# Patient Record
Sex: Female | Born: 1985 | Race: Black or African American | Hispanic: No | Marital: Married | Smoking: Never smoker
Health system: Southern US, Community
[De-identification: ages and names within clinical notes are randomized; demographics above are authoritative.]

## PROBLEM LIST (undated history)

## (undated) DIAGNOSIS — J45990 Exercise induced bronchospasm: Secondary | ICD-10-CM

## (undated) DIAGNOSIS — Z8759 Personal history of other complications of pregnancy, childbirth and the puerperium: Secondary | ICD-10-CM

## (undated) DIAGNOSIS — K219 Gastro-esophageal reflux disease without esophagitis: Secondary | ICD-10-CM

## (undated) DIAGNOSIS — Z22330 Carrier of Group B streptococcus: Secondary | ICD-10-CM

## (undated) DIAGNOSIS — E669 Obesity, unspecified: Secondary | ICD-10-CM

## (undated) DIAGNOSIS — Z8709 Personal history of other diseases of the respiratory system: Secondary | ICD-10-CM

## (undated) DIAGNOSIS — O24419 Gestational diabetes mellitus in pregnancy, unspecified control: Secondary | ICD-10-CM

## (undated) DIAGNOSIS — F329 Major depressive disorder, single episode, unspecified: Secondary | ICD-10-CM

## (undated) DIAGNOSIS — O09299 Supervision of pregnancy with other poor reproductive or obstetric history, unspecified trimester: Secondary | ICD-10-CM

## (undated) DIAGNOSIS — R002 Palpitations: Secondary | ICD-10-CM

## (undated) DIAGNOSIS — F32A Depression, unspecified: Secondary | ICD-10-CM

## (undated) HISTORY — DX: Personal history of other diseases of the respiratory system: Z87.09

## (undated) HISTORY — DX: Carrier of group B Streptococcus: Z22.330

## (undated) HISTORY — DX: Major depressive disorder, single episode, unspecified: F32.9

## (undated) HISTORY — DX: Gastro-esophageal reflux disease without esophagitis: K21.9

## (undated) HISTORY — DX: Depression, unspecified: F32.A

## (undated) HISTORY — DX: Supervision of pregnancy with other poor reproductive or obstetric history, unspecified trimester: O09.299

## (undated) HISTORY — DX: Gestational diabetes mellitus in pregnancy, unspecified control: O24.419

## (undated) HISTORY — DX: Personal history of other complications of pregnancy, childbirth and the puerperium: Z87.59

## (undated) HISTORY — DX: Palpitations: R00.2

## (undated) HISTORY — DX: Exercise induced bronchospasm: J45.990

## (undated) HISTORY — PX: BREAST LUMPECTOMY: SHX2

---

## 2008-01-30 HISTORY — PX: WISDOM TOOTH EXTRACTION: SHX21

## 2014-02-15 LAB — OB RESULTS CONSOLE GC/CHLAMYDIA
CHLAMYDIA, DNA PROBE: NEGATIVE
GC PROBE AMP, GENITAL: NEGATIVE

## 2014-02-15 LAB — OB RESULTS CONSOLE ABO/RH: RH TYPE: POSITIVE

## 2014-02-15 LAB — OB RESULTS CONSOLE ANTIBODY SCREEN: Antibody Screen: NEGATIVE

## 2014-02-15 LAB — OB RESULTS CONSOLE RPR: RPR: NONREACTIVE

## 2014-02-15 LAB — OB RESULTS CONSOLE HIV ANTIBODY (ROUTINE TESTING): HIV: NONREACTIVE

## 2014-02-15 LAB — OB RESULTS CONSOLE PLATELET COUNT: PLATELETS: 299 10*3/uL

## 2014-02-15 LAB — OB RESULTS CONSOLE HEPATITIS B SURFACE ANTIGEN: Hepatitis B Surface Ag: NEGATIVE

## 2014-02-15 LAB — OB RESULTS CONSOLE HGB/HCT, BLOOD: HEMOGLOBIN: 12.5 g/dL

## 2014-09-03 LAB — OB RESULTS CONSOLE GBS: STREP GROUP B AG: NEGATIVE

## 2014-10-04 ENCOUNTER — Encounter (HOSPITAL_COMMUNITY): Payer: Self-pay | Admitting: *Deleted

## 2014-10-04 ENCOUNTER — Inpatient Hospital Stay (HOSPITAL_COMMUNITY)
Admission: AD | Admit: 2014-10-04 | Discharge: 2014-10-08 | DRG: 765 | Disposition: A | Discharge status: Home / Self Care | Payer: BC Managed Care – PPO | Source: Ambulatory Visit | Attending: Obstetrics and Gynecology | Admitting: Obstetrics and Gynecology

## 2014-10-04 ENCOUNTER — Encounter (HOSPITAL_COMMUNITY)
Admission: AD | Disposition: A | Discharge status: Home / Self Care | Payer: Self-pay | Source: Ambulatory Visit | Attending: Obstetrics and Gynecology

## 2014-10-04 ENCOUNTER — Inpatient Hospital Stay (HOSPITAL_COMMUNITY): Payer: BC Managed Care – PPO | Admitting: Anesthesiology

## 2014-10-04 DIAGNOSIS — O480 Post-term pregnancy: Principal | ICD-10-CM | POA: Diagnosis present

## 2014-10-04 DIAGNOSIS — O133 Gestational [pregnancy-induced] hypertension without significant proteinuria, third trimester: Secondary | ICD-10-CM | POA: Diagnosis present

## 2014-10-04 DIAGNOSIS — Z6837 Body mass index (BMI) 37.0-37.9, adult: Secondary | ICD-10-CM | POA: Diagnosis not present

## 2014-10-04 DIAGNOSIS — O1493 Unspecified pre-eclampsia, third trimester: Secondary | ICD-10-CM | POA: Diagnosis present

## 2014-10-04 DIAGNOSIS — O99214 Obesity complicating childbirth: Secondary | ICD-10-CM | POA: Diagnosis present

## 2014-10-04 DIAGNOSIS — Z3A41 41 weeks gestation of pregnancy: Secondary | ICD-10-CM | POA: Diagnosis present

## 2014-10-04 DIAGNOSIS — Z98891 History of uterine scar from previous surgery: Secondary | ICD-10-CM

## 2014-10-04 DIAGNOSIS — O139 Gestational [pregnancy-induced] hypertension without significant proteinuria, unspecified trimester: Secondary | ICD-10-CM | POA: Diagnosis present

## 2014-10-04 DIAGNOSIS — J45990 Exercise induced bronchospasm: Secondary | ICD-10-CM | POA: Diagnosis not present

## 2014-10-04 HISTORY — DX: Obesity, unspecified: E66.9

## 2014-10-04 LAB — COMPREHENSIVE METABOLIC PANEL
ALT: 12 U/L — ABNORMAL LOW (ref 14–54)
ANION GAP: 7 (ref 5–15)
AST: 24 U/L (ref 15–41)
Albumin: 3 g/dL — ABNORMAL LOW (ref 3.5–5.0)
Alkaline Phosphatase: 147 U/L — ABNORMAL HIGH (ref 38–126)
BILIRUBIN TOTAL: 0.4 mg/dL (ref 0.3–1.2)
BUN: 9 mg/dL (ref 6–20)
CO2: 22 mmol/L (ref 22–32)
Calcium: 9.2 mg/dL (ref 8.9–10.3)
Chloride: 105 mmol/L (ref 101–111)
Creatinine, Ser: 0.65 mg/dL (ref 0.44–1.00)
GFR calc Af Amer: >60 mL/min (ref >60)
Glucose, Bld: 88 mg/dL (ref 65–99)
POTASSIUM: 4.1 mmol/L (ref 3.5–5.1)
Sodium: 134 mmol/L — ABNORMAL LOW (ref 135–145)
TOTAL PROTEIN: 6.4 g/dL — AB (ref 6.5–8.1)

## 2014-10-04 LAB — RPR: RPR Ser Ql: NONREACTIVE

## 2014-10-04 LAB — CBC
HCT: 34.3 % — ABNORMAL LOW (ref 36.0–46.0)
Hemoglobin: 11.6 g/dL — ABNORMAL LOW (ref 12.0–15.0)
MCH: 28.9 pg (ref 26.0–34.0)
MCHC: 33.8 g/dL (ref 30.0–36.0)
MCV: 85.3 fL (ref 78.0–100.0)
PLATELETS: 161 10*3/uL (ref 150–400)
RBC: 4.02 MIL/uL (ref 3.87–5.11)
RDW: 14.6 % (ref 11.5–15.5)
WBC: 9.5 10*3/uL (ref 4.0–10.5)

## 2014-10-04 LAB — ABO/RH: ABO/RH(D): A POS

## 2014-10-04 LAB — URIC ACID: Uric Acid, Serum: 5 mg/dL (ref 2.3–6.6)

## 2014-10-04 LAB — LACTATE DEHYDROGENASE: LDH: 183 U/L (ref 98–192)

## 2014-10-04 LAB — PROTEIN / CREATININE RATIO, URINE
Creatinine, Urine: 108 mg/dL
Protein Creatinine Ratio: 0.53 mg/mg{Cre} — ABNORMAL HIGH (ref 0.00–0.15)
Total Protein, Urine: 57 mg/dL

## 2014-10-04 LAB — TYPE AND SCREEN
ABO/RH(D): A POS
ANTIBODY SCREEN: NEGATIVE

## 2014-10-04 LAB — HIV ANTIBODY (ROUTINE TESTING W REFLEX): HIV SCREEN 4TH GENERATION: NONREACTIVE

## 2014-10-04 LAB — OB RESULTS CONSOLE RUBELLA ANTIBODY, IGM: RUBELLA: IMMUNE

## 2014-10-04 SURGERY — Surgical Case
Anesthesia: Epidural | Site: Abdomen

## 2014-10-04 MED ORDER — EPHEDRINE 5 MG/ML INJ: 10.0000 mg | INTRAVENOUS | Status: DC | PRN

## 2014-10-04 MED ORDER — CEFAZOLIN SODIUM-DEXTROSE 2-3 GM-% IV SOLR
2.0000 g | Freq: Once | INTRAVENOUS | Status: DC
  Filled 2014-10-04: qty 50

## 2014-10-04 MED ORDER — LACTATED RINGERS IV SOLN
INTRAVENOUS | Status: DC | PRN
  Administered 2014-10-04 (×2): via INTRAVENOUS

## 2014-10-04 MED ORDER — ACETAMINOPHEN 325 MG PO TABS: 650.0000 mg | ORAL_TABLET | ORAL | Status: DC | PRN

## 2014-10-04 MED ORDER — DIPHENHYDRAMINE HCL 50 MG/ML IJ SOLN: 12.5000 mg | INTRAMUSCULAR | Status: DC | PRN

## 2014-10-04 MED ORDER — TERBUTALINE SULFATE 1 MG/ML IJ SOLN: 0.2500 mg | Freq: Once | INTRAMUSCULAR | Status: DC | PRN

## 2014-10-04 MED ORDER — LACTATED RINGERS IV SOLN
INTRAVENOUS | Status: DC
  Administered 2014-10-04: 16:00:00 via INTRAUTERINE

## 2014-10-04 MED ORDER — ONDANSETRON HCL 4 MG/2ML IJ SOLN
INTRAMUSCULAR | Status: DC | PRN
  Administered 2014-10-04: 4 mg via INTRAVENOUS

## 2014-10-04 MED ORDER — KETOROLAC TROMETHAMINE 30 MG/ML IJ SOLN
30.0000 mg | Freq: Four times a day (QID) | INTRAMUSCULAR | Status: DC | PRN
  Filled 2014-10-04: qty 1

## 2014-10-04 MED ORDER — HYDRALAZINE HCL 20 MG/ML IJ SOLN: 10.0000 mg | Freq: Once | INTRAMUSCULAR | Status: DC | PRN

## 2014-10-04 MED ORDER — OXYCODONE-ACETAMINOPHEN 5-325 MG PO TABS: 2.0000 | ORAL_TABLET | ORAL | Status: DC | PRN

## 2014-10-04 MED ORDER — LIDOCAINE HCL 2 % IJ SOLN
INTRAMUSCULAR | Status: DC | PRN
  Administered 2014-10-04: 8 mL

## 2014-10-04 MED ORDER — LIDOCAINE HCL (PF) 1 % IJ SOLN: 30.0000 mL | INTRAMUSCULAR | Status: DC | PRN

## 2014-10-04 MED ORDER — CITRIC ACID-SODIUM CITRATE 334-500 MG/5ML PO SOLN
30.0000 mL | ORAL | Status: DC | PRN
  Administered 2014-10-04: 30 mL via ORAL
  Filled 2014-10-04: qty 15

## 2014-10-04 MED ORDER — PHENYLEPHRINE 40 MCG/ML (10ML) SYRINGE FOR IV PUSH (FOR BLOOD PRESSURE SUPPORT)
80.0000 ug | PREFILLED_SYRINGE | INTRAVENOUS | Status: DC | PRN
  Filled 2014-10-04: qty 20

## 2014-10-04 MED ORDER — LIDOCAINE HCL (PF) 1 % IJ SOLN
INTRAMUSCULAR | Status: DC | PRN
  Administered 2014-10-04 (×2): 9 mL via EPIDURAL

## 2014-10-04 MED ORDER — FENTANYL 2.5 MCG/ML BUPIVACAINE 1/10 % EPIDURAL INFUSION (WH - ANES)
14.0000 mL/h | INTRAMUSCULAR | Status: DC | PRN
  Administered 2014-10-04 (×3): 14 mL/h via EPIDURAL
  Filled 2014-10-04 (×2): qty 125

## 2014-10-04 MED ORDER — OXYTOCIN 40 UNITS IN LACTATED RINGERS INFUSION - SIMPLE MED: 62.5000 mL/h | INTRAVENOUS | Status: DC

## 2014-10-04 MED ORDER — ZOLPIDEM TARTRATE 5 MG PO TABS: 5.0000 mg | ORAL_TABLET | Freq: Every evening | ORAL | Status: DC | PRN

## 2014-10-04 MED ORDER — OXYTOCIN BOLUS FROM INFUSION: 500.0000 mL | INTRAVENOUS | Status: DC

## 2014-10-04 MED ORDER — MORPHINE SULFATE 0.5 MG/ML IJ SOLN
INTRAMUSCULAR | Status: AC
Start: 2014-10-04 — End: 2014-10-04
  Filled 2014-10-04: qty 100

## 2014-10-04 MED ORDER — LIDOCAINE HCL 2 % IJ SOLN
INTRAMUSCULAR | Status: AC
  Filled 2014-10-04: qty 20

## 2014-10-04 MED ORDER — STERILE WATER FOR IRRIGATION IR SOLN
Status: DC | PRN
  Administered 2014-10-04: 1000 mL via INTRAVESICAL

## 2014-10-04 MED ORDER — ONDANSETRON HCL 4 MG/2ML IJ SOLN
INTRAMUSCULAR | Status: AC
  Filled 2014-10-04: qty 2

## 2014-10-04 MED ORDER — FENTANYL CITRATE (PF) 100 MCG/2ML IJ SOLN
100.0000 ug | INTRAMUSCULAR | Status: DC | PRN
  Administered 2014-10-04 (×2): 100 ug via INTRAVENOUS
  Filled 2014-10-04 (×2): qty 2

## 2014-10-04 MED ORDER — FENTANYL CITRATE (PF) 100 MCG/2ML IJ SOLN
INTRAMUSCULAR | Status: AC
  Filled 2014-10-04: qty 4

## 2014-10-04 MED ORDER — LABETALOL HCL 5 MG/ML IV SOLN: 20.0000 mg | INTRAVENOUS | Status: DC | PRN

## 2014-10-04 MED ORDER — SODIUM CHLORIDE 0.9 % IR SOLN
Status: DC | PRN
  Administered 2014-10-04: 1000 mL

## 2014-10-04 MED ORDER — OXYTOCIN 10 UNIT/ML IJ SOLN
INTRAMUSCULAR | Status: AC
  Filled 2014-10-04: qty 4

## 2014-10-04 MED ORDER — OXYCODONE-ACETAMINOPHEN 5-325 MG PO TABS: 1.0000 | ORAL_TABLET | ORAL | Status: DC | PRN

## 2014-10-04 MED ORDER — SCOPOLAMINE 1 MG/3DAYS TD PT72
MEDICATED_PATCH | TRANSDERMAL | Status: DC | PRN
  Administered 2014-10-04: 1 via TRANSDERMAL

## 2014-10-04 MED ORDER — CEFAZOLIN SODIUM-DEXTROSE 2-3 GM-% IV SOLR
INTRAVENOUS | Status: DC | PRN
  Administered 2014-10-04: 2 g via INTRAVENOUS

## 2014-10-04 MED ORDER — ONDANSETRON HCL 4 MG/2ML IJ SOLN: 4.0000 mg | Freq: Four times a day (QID) | INTRAMUSCULAR | Status: DC | PRN

## 2014-10-04 MED ORDER — PHENYLEPHRINE HCL 10 MG/ML IJ SOLN
INTRAMUSCULAR | Status: DC | PRN
  Administered 2014-10-04: 40 ug via INTRAVENOUS

## 2014-10-04 MED ORDER — MORPHINE SULFATE (PF) 0.5 MG/ML IJ SOLN
INTRAMUSCULAR | Status: DC | PRN
Start: 2014-10-04 — End: 2014-10-05
  Administered 2014-10-04: 4 mg via EPIDURAL
  Administered 2014-10-04 (×2): .5 mg via EPIDURAL

## 2014-10-04 MED ORDER — FENTANYL CITRATE (PF) 100 MCG/2ML IJ SOLN
INTRAMUSCULAR | Status: DC | PRN
  Administered 2014-10-04: 25 ug via INTRAVENOUS
  Administered 2014-10-04: 20 ug via INTRAVENOUS
  Administered 2014-10-04 (×2): 25 ug via INTRAVENOUS

## 2014-10-04 MED ORDER — LACTATED RINGERS IV SOLN
500.0000 mL | INTRAVENOUS | Status: DC | PRN
  Administered 2014-10-04 (×2): 500 mL via INTRAVENOUS
  Administered 2014-10-04: 1000 mL via INTRAVENOUS
  Administered 2014-10-04: 500 mL via INTRAVENOUS

## 2014-10-04 MED ORDER — LACTATED RINGERS IV SOLN
INTRAVENOUS | Status: DC
  Administered 2014-10-04 (×3): via INTRAVENOUS

## 2014-10-04 MED ORDER — SCOPOLAMINE 1 MG/3DAYS TD PT72
MEDICATED_PATCH | TRANSDERMAL | Status: AC
  Filled 2014-10-04: qty 1

## 2014-10-04 MED ORDER — OXYTOCIN 40 UNITS IN LACTATED RINGERS INFUSION - SIMPLE MED: 1.0000 m[IU]/min | INTRAVENOUS | Status: DC

## 2014-10-04 MED ORDER — MEPERIDINE HCL 25 MG/ML IJ SOLN
INTRAMUSCULAR | Status: AC
  Filled 2014-10-04: qty 1

## 2014-10-04 MED ORDER — OXYTOCIN 10 UNIT/ML IJ SOLN
40.0000 [IU] | INTRAMUSCULAR | Status: DC | PRN
  Administered 2014-10-04: 40 [IU] via INTRAVENOUS

## 2014-10-04 MED ORDER — MEPERIDINE HCL 25 MG/ML IJ SOLN
INTRAMUSCULAR | Status: DC | PRN
Start: 2014-10-04 — End: 2014-10-05
  Administered 2014-10-04 (×2): 12.5 mg via INTRAVENOUS

## 2014-10-04 MED ORDER — OXYTOCIN 40 UNITS IN LACTATED RINGERS INFUSION - SIMPLE MED
1.0000 m[IU]/min | INTRAVENOUS | Status: DC
  Administered 2014-10-04: 1 m[IU]/min via INTRAVENOUS
  Filled 2014-10-04: qty 1000

## 2014-10-04 MED ORDER — FLEET ENEMA 7-19 GM/118ML RE ENEM: 1.0000 | ENEMA | RECTAL | Status: DC | PRN

## 2014-10-04 SURGICAL SUPPLY — 35 items
BENZOIN TINCTURE PRP APPL 2/3 (GAUZE/BANDAGES/DRESSINGS) ×2 IMPLANT
CLAMP CORD UMBIL (MISCELLANEOUS) IMPLANT
CLOTH BEACON ORANGE TIMEOUT ST (SAFETY) ×2 IMPLANT
CONTAINER PREFILL 10% NBF 15ML (MISCELLANEOUS) IMPLANT
DRAIN JACKSON PRT FLT 10 (DRAIN) IMPLANT
DRAPE SHEET LG 3/4 BI-LAMINATE (DRAPES) IMPLANT
DRSG OPSITE POSTOP 4X10 (GAUZE/BANDAGES/DRESSINGS) ×2 IMPLANT
DURAPREP 26ML APPLICATOR (WOUND CARE) ×2 IMPLANT
ELECT REM PT RETURN 9FT ADLT (ELECTROSURGICAL) ×2
ELECTRODE REM PT RTRN 9FT ADLT (ELECTROSURGICAL) ×1 IMPLANT
EVACUATOR SILICONE 100CC (DRAIN) IMPLANT
EXTRACTOR VACUUM M CUP 4 TUBE (SUCTIONS) IMPLANT
GLOVE BIO SURGEON STRL SZ 6.5 (GLOVE) ×2 IMPLANT
GLOVE BIOGEL PI IND STRL 7.0 (GLOVE) ×1 IMPLANT
GLOVE BIOGEL PI INDICATOR 7.0 (GLOVE) ×1
GOWN STRL REUS W/TWL LRG LVL3 (GOWN DISPOSABLE) ×4 IMPLANT
HEMOSTAT SURGICEL 4X8 (HEMOSTASIS) ×2 IMPLANT
KIT ABG SYR 3ML LUER SLIP (SYRINGE) IMPLANT
NEEDLE HYPO 25X5/8 SAFETYGLIDE (NEEDLE) IMPLANT
NS IRRIG 1000ML POUR BTL (IV SOLUTION) ×2 IMPLANT
PACK C SECTION WH (CUSTOM PROCEDURE TRAY) ×2 IMPLANT
PAD OB MATERNITY 4.3X12.25 (PERSONAL CARE ITEMS) ×2 IMPLANT
PENCIL SMOKE EVAC W/HOLSTER (ELECTROSURGICAL) ×2 IMPLANT
RTRCTR C-SECT PINK 25CM LRG (MISCELLANEOUS) ×2 IMPLANT
STRIP CLOSURE SKIN 1/2X4 (GAUZE/BANDAGES/DRESSINGS) ×2 IMPLANT
SUT CHROMIC 0 CT 1 (SUTURE) ×2 IMPLANT
SUT MNCRL AB 3-0 PS2 27 (SUTURE) ×2 IMPLANT
SUT PLAIN 2 0 (SUTURE) ×2
SUT PLAIN 2 0 XLH (SUTURE) ×2 IMPLANT
SUT PLAIN ABS 2-0 CT1 27XMFL (SUTURE) ×2 IMPLANT
SUT SILK 2 0 SH (SUTURE) IMPLANT
SUT VIC AB 0 CTX 36 (SUTURE) ×4
SUT VIC AB 0 CTX36XBRD ANBCTRL (SUTURE) ×4 IMPLANT
TOWEL OR 17X24 6PK STRL BLUE (TOWEL DISPOSABLE) ×2 IMPLANT
TRAY FOLEY CATH SILVER 14FR (SET/KITS/TRAYS/PACK) ×2 IMPLANT

## 2014-10-04 NOTE — Progress Notes (Addendum)
  Subjective: Assuming care of [name], 29 yo G1P0 @ 41.1 wks admitted this morning due to 1) SROM @ 0705, 2) Cat 2 FHRT and 3) Gestational HTN.  Denies h/a, visual changes, epigastric pain or difficulty breathing.  Comfortable w/ epidural for the most part. Self-administered PCA dose around 6:45 PM.   Family at bedside and supportive  Objective: BP 141/82 mmHg  Pulse 105  Temp(Src) 98.5 F (36.9 C) (Oral)  Resp 20  Ht  (1.676 m)  Wt 105.688 kg (233 lb)  BMI 37.63 kg/m2  SpO2 99% I/O last 3 completed shifts: In: -  Out: 400 [Urine:400]   Today's Vitals   10/04/14 1930 10/04/14 1939 10/04/14 2000 10/04/14 2008  BP: 132/75  141/82   Pulse: 114  105   Temp: 98.5 F (36.9 C)     TempSrc: Oral     Resp: 20     Height:      Weight:      SpO2:      PainSc:  0-No pain  2    Gen: NAD Lungs: CTAB CV: RRR w/o M/R/G Abdomen: gravid, soft between ctxs, NT Brachial DTRs 2+ bilaterally, pitting LE/pedal edema bilaterally  FHT: BL 145 w/ moderate variability, +accels --- 2 min late decel (7:38 PM - 7:40 PM) w/ slow recovery UC:  irregular, every 1-3 minutes, MVUs 70 SVE:   Dilation: 7 Effacement (%): 90 Station: -2 Exam by:: kim Dashay Giesler cnm Pitocin at 2 mU/min UOP 400 ml over previous hour --- urine noted to be bloody in tube/bag Urine PCR = 0.53  Assessment:  IUP at 41.1 wks SROM x 12 hours -- no evidence of infection Cat 2 FHRT Tachysystole Gestational HTN w/ elevated urine PCR   Plan: Pitocin reduced to 1 mU/min, then off when FHR was slow to recover. Fluid bolus, 02, and repositioning to far left side done. Consulted Dr. Normand Sloop of above --- will recheck cvx at 9 pm and advise her of exam. Also alerted her of bloody urine -- will observe closely at present.  Continue amnioinfusion. Informed pt and spouse of urine PCR results and the need for Magnesium Sulfate therapy in the pp period. Reviewed Magnesium Sulfate in detail. Will reiterate after delivery.     Sherre Scarlet CNM 10/04/2014, 8:26 PM

## 2014-10-04 NOTE — Progress Notes (Signed)
Subjective: Comfortable with epidural.  Objective: BP 136/72 mmHg  Pulse 100  Temp(Src) 98.4 F (36.9 C) (Oral)  Resp 18  Ht  (1.676 m)  Wt 105.688 kg (233 lb)  BMI 37.63 kg/m2  SpO2 99%   Total I/O In: -  Out: 400 [Urine:400]   Filed Vitals:   10/04/14 1733 10/04/14 1800 10/04/14 1830 10/04/14 1840  BP: 144/82 135/75 136/72   Pulse: 127 106 100   Temp:   98.3 F (36.8 C) 98.4 F (36.9 C)  TempSrc:   Oral Oral  Resp: Height:      Weight:      SpO2:        FHT: Category 1 in segments of reactivity and moderate variability.  Occasional quick variables at times.  UC:   regular, every 3-4 minutes SVE:   7 cm, 100%, vtx, -2.   MVUs 120  Results for orders placed or performed during the hospital encounter of 10/04/14 (from the past 24 hour(s))  HIV antibody     Status: None   Collection Time: 10/04/14  8:15 AM  Result Value Ref Range   HIV Screen 4th Generation wRfx Non Reactive Non Reactive  CBC     Status: Abnormal   Collection Time: 10/04/14  8:15 AM  Result Value Ref Range   WBC 9.5 4.0 - 10.5 K/uL   RBC 4.02 3.87 - 5.11 MIL/uL   Hemoglobin 11.6 (L) 12.0 - 15.0 g/dL   HCT 16.1 (L) 09.6 - 04.5 %   MCV 85.3 78.0 - 100.0 fL   MCH 28.9 26.0 - 34.0 pg   MCHC 33.8 30.0 - 36.0 g/dL   RDW 40.9 81.1 - 91.4 %   Platelets 161 150 - 400 K/uL  Type and screen     Status: None   Collection Time: 10/04/14  8:15 AM  Result Value Ref Range   ABO/RH(D) A POS    Antibody Screen NEG    Sample Expiration 10/07/2014   RPR     Status: None   Collection Time: 10/04/14  8:15 AM  Result Value Ref Range   RPR Ser Ql Non Reactive Non Reactive  Comprehensive metabolic panel     Status: Abnormal   Collection Time: 10/04/14  8:15 AM  Result Value Ref Range   Sodium 134 (L) 135 - 145 mmol/L   Potassium 4.1 3.5 - 5.1 mmol/L   Chloride 105 101 - 111 mmol/L   CO2 22 22 - 32 mmol/L   Glucose, Bld 88 65 - 99 mg/dL   BUN 9 6 - 20 mg/dL   Creatinine, Ser 7.82 0.44 -  1.00 mg/dL   Calcium 9.2 8.9 - 95.6 mg/dL   Total Protein 6.4 (L) 6.5 - 8.1 g/dL   Albumin 3.0 (L) 3.5 - 5.0 g/dL   AST 24 15 - 41 U/L   ALT 12 (L) 14 - 54 U/L   Alkaline Phosphatase 147 (H) 38 - 126 U/L   Total Bilirubin 0.4 0.3 - 1.2 mg/dL   GFR calc non Af Amer >60 >60 mL/min   GFR calc Af Amer >60 >60 mL/min   Anion gap 7 5 - 15  Lactate dehydrogenase     Status: None   Collection Time: 10/04/14  8:15 AM  Result Value Ref Range   LDH 183 98 - 192 U/L  Uric acid     Status: None   Collection Time: 10/04/14  8:15 AM  Result Value Ref Range  Uric Acid, Serum 5.0 2.3 - 6.6 mg/dL  ABO/Rh     Status: None   Collection Time: 10/04/14  8:15 AM  Result Value Ref Range   ABO/RH(D) A POS   Protein / creatinine ratio, urine     Status: Abnormal   Collection Time: 10/04/14  4:25 PM  Result Value Ref Range   Creatinine, Urine 108.00 mg/dL   Total Protein, Urine 57 mg/dL   Protein Creatinine Ratio 0.53 (H) 0.00 - 0.15 mg/mg[Cre]    Assessment:  Active labor, but inadequate contractions. Gestational hypertension with superimposed pre-eclampsia, without severe features.  Plan: Recommended pitocin augmentation to facilitate quality of UCs. Consulted with Dr. Normand Sloop. Will increase pitocin 2x2 and observe FHR status closely. Plan magnesium sulfate for 24 hours pp Report to Verlon Au, CNM.  Nigel Bridgeman CNM, [place] 10/04/2014, 6:53 PM

## 2014-10-04 NOTE — Progress Notes (Signed)
  Subjective: Comfortable with epidural.  Objective: BP 118/73 mmHg  Pulse 89  Temp(Src) 98.2 F (36.8 C) (Oral)  Resp 18  Ht  (1.676 m)  Wt 105.688 kg (233 lb)  BMI 37.63 kg/m2  SpO2 100%      FHT: Category 2--2 episodes of prolonged decels x 3-5 min--variability good throughout, resolved with position change. UC:   irregular, every 1-3 minutes SVE:   Dilation: 6 Effacement (%): 90 Station: -1 Exam by:: vlatham,cnm  Fetus feels asynclitic/posterior MVUs 155 Positive scalp stim response.  Assessment:  Active labor Category 2 FHR  Plan: Amnioinfusion Position change to facilitate rotation/descent. Close observation of FHR status.  Nigel Bridgeman CNM 10/04/2014, 4:13 PM

## 2014-10-04 NOTE — Progress Notes (Signed)
  Subjective: Just received epidural.  Objective: BP 147/85 mmHg  Pulse 96  Temp(Src) 98.1 F (36.7 C) (Oral)  Resp 20  Ht  (1.676 m)  Wt 105.688 kg (233 lb)  BMI 37.63 kg/m2  SpO2 100%      Filed Vitals:   10/04/14 1221 10/04/14 1225 10/04/14 1229 10/04/14 1230  BP: 158/93 142/85  147/85  Pulse: 103 105  96  Temp:   98.1 F (36.7 C)   TempSrc:   Oral   Resp: Height:      Weight:      SpO2:        FHT: Category 2--moderate variability, single late decel, occasional quick variable. UC:   regular, every 4 minutes SVE:   Deferred    Assessment:  Early labor Mild gestational hypertension Light MSF  Plan: Plan recheck of cervix when epidural settled. Anticipate internal monitoring. Augmentation prn.  Nigel Bridgeman CNM 10/04/2014, 12:32 PM

## 2014-10-04 NOTE — Progress Notes (Signed)
Pt desires to proceed w/ c-section. Risks, Benefits, Alternatives including but not limited to bleeding, infection and injury were discussed. Family verbalized understanding. Reassuring FHRT.  Dr. Normand Sloop informed and enroute.  Will order Ancef 2 grams on call to OR.   Sherre Scarlet, CNM 10/04/14, 9:48 PM

## 2014-10-04 NOTE — Progress Notes (Signed)
S: No c/o.   O:  Today's Vitals   10/04/14 2008 10/04/14 2030 10/04/14 2037 10/04/14 2100  BP:  137/71  127/70  Pulse:  110  107  Temp:      TempSrc:      Resp:      Height:      Weight:      SpO2:      PainSc: 2   Asleep    FHRT: BL 135 w/ moderate variability, +scalp stim, mild variables, < 30 secs, w/ quick recovery; no lates. Ctxing q 2-3 min, MVUs 80-135. Cvx unchanged.  EFW 8 3/4 lbs by Leopold's.  A: No cervical change Reassuring FHRT ?LGA/asynclitic position GBS neg Inadequate MVUs  P: Discussed w/ family my concerns of no cervical change. Informed that I am reluctant to restart Pitocin as tachysystole was an issue earlier, resulting in late decels. Gave option to 1) restart Pitocin 1x1, or 2) proceed w/ primary c-section. Informed family that I will give them time to consider both options. Dr. Normand Sloop updated. Will advise her of family's decision once made.   Sherre Scarlet, CNM 10/04/14, 9:27 PM

## 2014-10-04 NOTE — H&P (Signed)
Victioria Beggs is a 29 y.o. female, G1P0 at 59 1/7 weeks, presenting for SROM at 0705, light MSF, with UCs q 3-5 min, moderate.  Denies HA, visual sx, or epigastric pain.  Reports +FM.  Planned waterbirth, with husband as Nature conservation officer.    Patient Active Problem List   Diagnosis Date Noted  . Normal labor 10/04/2014  . Meconium staining 10/04/2014  . Post term pregnancy, 41 weeks 10/04/2014  . Gestational hypertension 10/04/2014    History of present pregnancy: Patient entered care at 12 1/7 weeks.   EDC of 09/26/14 was established by LMP and in agreement with [place] at 13 weeks.   Anatomy scan: 19 1/7 weeks, with normal findings and a posterior placenta.  EFW 11 oz, 87.9%ile, normal fluid and cervical length Additional [place] evaluations:  None Significant prenatal events:  Entered care at 12 1/7 weeks.  Treated for + urine culture after NOB interview.  TDAP 06/30/14.  Attended WB class, consents signed 07/28/14. Last evaluation:  09/18/14--cervix 1 cm, thick, vtx, -4.  BP 124/64, weight 234, 3+ edema.  Membranes swept.  OB History    Gravida Para Term Preterm AB TAB SAB Ectopic Multiple Living   1         0     Past Medical History  Diagnosis Date  . Obesity    Past Surgical History  Procedure Laterality Date  . Breast lumpectomy    Wisdom teeth 2010  Family History: Mother HTN; MGM HTN, dementia; PFM HTN, aneurysm, DM; PA DM.  Social History:  reports that she has never smoked. She does not have any smokeless tobacco history on file. She reports that she drinks alcohol. She reports that she does not use illicit drugs.  Patient is African American, of the [place] faith, post-grad educated, employed as a Runner, broadcasting/film/video, married to Troy, who is involved and supportive.   Prenatal Transfer Tool  Maternal Diabetes: No Genetic Screening: Normal 1st trimester screen and AFP Maternal Ultrasounds/Referrals: Normal Fetal Ultrasounds or other Referrals:  None Maternal Substance Abuse:   No Significant Maternal Medications:  None Significant Maternal Lab Results: Lab values include: Group B Strep negative  TDAP 06/30/14 Flu NA  ROS:  Leaking fluid, contractions, +FM.  Allergies not on file   Dilation: 2 Effacement (%): 80 Station: -2 Exam by:: Manfred Arch, CNM Blood pressure 155/94, pulse 89, SpO2 100 %.  Chest clear Heart RRR without murmur Abd gravid, NT, FH 38 cm Pelvic: As above, leaking light MSF Ext:  DTR 2+, no clonus, 2+ edema  FHR: Category 2--moderate variability, occasional late decels UCs:  q 3 min, moderate  Prenatal labs: ABO, Rh: A/Positive/-- (01/18 0000) Antibody: Negative (01/18 0000) Rubella:   Immune RPR: Nonreactive (01/18 0000)  HBsAg: Negative (01/18 0000)  HIV: Non-reactive (01/18 0000)  GBS:  Negative 09/03/14 Sickle cell/Hgb electrophoresis:  AA Pap:   GC:  Negative 02/15/14 Chlamydia:  Negative 02/15/14 Genetic screenings:  Normal 1st trimester screen and AFP Glucola:  WNL Other:   Hgb 12.5 at NOB, 11.1 at 28 weeks 100k Enterococcus and Klebsiella on NOB urine, negative on TOC.   Assessment/Plan: IUP at 41 1/7 weeks SROM at 0705 Light MSF Elevated BP Category 2 FHR  Plan: Admit to [facility] per consult with Dr. Normand Sloop Routine [facility] orders Pain med/epidural prn No tub use at present due to elevated BP and Category 2 FHR IV hydration PIH labs Continuous EFM  Aleia Larocca, VICKICNM, [place] 10/04/2014, 8:12 AM

## 2014-10-04 NOTE — MAU Note (Signed)
Contractions since yesterday. Water broke at 0705.

## 2014-10-04 NOTE — Progress Notes (Signed)
  Subjective: Comfortable with epidural.  Objective: BP 146/127 mmHg  Pulse 100  Temp(Src) 98.1 F (36.7 C) (Oral)  Resp 18  Ht  (1.676 m)  Wt 105.688 kg (233 lb)  BMI 37.63 kg/m2  SpO2 100%      Filed Vitals:   10/04/14 1246 10/04/14 1300 10/04/14 1330 10/04/14 1400  BP: 131/83 146/97 153/102 146/127  Pulse: 108 101 93 100  Temp:      TempSrc:      Resp: Height:      Weight:      SpO2:       Last BP taken during exam and repositioning patient--likely incorrect.  FHT: Category 2--quick variables, moderate variability with exam. UC:   irregular, every 2-4 minutes SVE:   Dilation: 5 Effacement (%): 90 Station: -1 Exam by:: v Eduar Kumpf,cnm  IUPC and FSE inserted without difficulty Foley placed without issue.  Assessment:  Progressive labor GBS negative Light MSF  Plan: Continue current care. Watch BP Reviewed option of augmentation with patient--she hopes to avoid pitocin if possible.   Nigel Bridgeman CNM 10/04/2014, 2:05 PM

## 2014-10-04 NOTE — Anesthesia Procedure Notes (Signed)
Epidural Patient location during procedure: OB Start time: 10/04/2014 12:14 PM End time: 10/04/2014 12:18 PM  Staffing Anesthesiologist: Leilani Able Performed by: anesthesiologist   Preanesthetic Checklist Completed: patient identified, surgical consent, pre-op evaluation, timeout performed, IV checked, risks and benefits discussed and monitors and equipment checked  Epidural Patient position: sitting Prep: site prepped and draped and DuraPrep Patient monitoring: continuous pulse ox and blood pressure Approach: midline Location: L3-L4 Injection technique: LOR air  Needle:  Needle type: Tuohy  Needle gauge: 17 G Needle length: 9 cm and 9 Needle insertion depth: 9 cm Catheter type: closed end flexible Catheter size: 19 Gauge Catheter at skin depth: 15 cm Test dose: negative and Other  Assessment Sensory level: T9 Events: blood not aspirated, injection not painful, no injection resistance, negative IV test and no paresthesia  Additional Notes Reason for block:procedure for pain

## 2014-10-04 NOTE — Anesthesia Preprocedure Evaluation (Addendum)
Anesthesia Evaluation  Patient identified by MRN, date of birth, ID band Patient awake    Reviewed: Allergy & Precautions, H&P , NPO status , Patient's Chart, lab work & pertinent test results  Airway Mallampati: II  TM Distance: >3 FB Neck ROM: full    Dental no notable dental hx.    Pulmonary    Pulmonary exam normal       Cardiovascular Normal cardiovascular exam    Neuro/Psych negative neurological ROS  negative psych ROS   GI/Hepatic negative GI ROS, Neg liver ROS,   Endo/Other  Morbid obesity  Renal/GU negative Renal ROS     Musculoskeletal   Abdominal (+) + obese,   Peds  Hematology negative hematology ROS (+)   Anesthesia Other Findings   Reproductive/Obstetrics (+) Pregnancy                            Anesthesia Physical Anesthesia Plan  ASA: III  Anesthesia Plan: Epidural   Post-op Pain Management:    Induction:   Airway Management Planned:   Additional Equipment:   Intra-op Plan:   Post-operative Plan:   Informed Consent: I have reviewed the patients History and Physical, chart, labs and discussed the procedure including the risks, benefits and alternatives for the proposed anesthesia with the patient or authorized representative who has indicated his/her understanding and acceptance.     Plan Discussed with: CRNA and Surgeon  Anesthesia Plan Comments: (For C/S with epidural in - situ.)       Anesthesia Quick Evaluation

## 2014-10-04 NOTE — Progress Notes (Signed)
  Subjective: Breathing with UCs--husband and mother at bedside, supportive.  Patient has received 2 doses Fentanyl with benefit at 0904 and 1016.  Has declined epidural. Back pain with UCs.  Objective: BP 142/64 mmHg  Pulse 79  Temp(Src) 98.2 F (36.8 C) (Oral)  Resp 18  Ht  (1.676 m)  Wt 105.688 kg (233 lb)  BMI 37.63 kg/m2  SpO2 100%      Filed Vitals:   10/04/14 0937 10/04/14 1020 10/04/14 1033 10/04/14 1113  BP: 139/75  142/64   Pulse: 81  79   Temp:  98.2 F (36.8 C)    TempSrc:  Oral    Resp: Height:      Weight:      SpO2:        FHT: Category 2--decreased variability s/p Fentanyl doses, no lates.  Occasional very quick variables. UC:   irregular, every 2-5 minutes, moderate SVE:  Deferred at present  Assessment:  Early labor SROM at 0705 GBS negative Category 2 FHR Initial BP elevation, now improved.  Plan: Continue current care Position to facilitate rotation/descent. Re-check cervix by 12 noon or prn.  Nigel Bridgeman CNM 10/04/2014, 11:15 AM

## 2014-10-04 NOTE — Plan of Care (Signed)
Problem: Phase I Progression Outcomes Goal: FHR checked 5 minutes after meds (ROM) Rupture of Membranes SROM AT HOME     

## 2014-10-05 ENCOUNTER — Encounter (HOSPITAL_COMMUNITY): Payer: Self-pay | Admitting: *Deleted

## 2014-10-05 DIAGNOSIS — Z98891 History of uterine scar from previous surgery: Secondary | ICD-10-CM

## 2014-10-05 DIAGNOSIS — O1493 Unspecified pre-eclampsia, third trimester: Secondary | ICD-10-CM | POA: Diagnosis present

## 2014-10-05 LAB — CBC
HEMATOCRIT: 32.3 % — AB (ref 36.0–46.0)
HEMOGLOBIN: 10.6 g/dL — AB (ref 12.0–15.0)
MCH: 28.4 pg (ref 26.0–34.0)
MCHC: 32.8 g/dL (ref 30.0–36.0)
MCV: 86.6 fL (ref 78.0–100.0)
Platelets: 137 10*3/uL — ABNORMAL LOW (ref 150–400)
RBC: 3.73 MIL/uL — AB (ref 3.87–5.11)
RDW: 14.8 % (ref 11.5–15.5)
WBC: 15.6 10*3/uL — AB (ref 4.0–10.5)

## 2014-10-05 LAB — COMPREHENSIVE METABOLIC PANEL
ALBUMIN: 2.5 g/dL — AB (ref 3.5–5.0)
ALT: 11 U/L — ABNORMAL LOW (ref 14–54)
ANION GAP: 12 (ref 5–15)
AST: 27 U/L (ref 15–41)
Alkaline Phosphatase: 114 U/L (ref 38–126)
BUN: 8 mg/dL (ref 6–20)
CHLORIDE: 97 mmol/L — AB (ref 101–111)
CO2: 22 mmol/L (ref 22–32)
Calcium: 8.6 mg/dL — ABNORMAL LOW (ref 8.9–10.3)
Creatinine, Ser: 0.83 mg/dL (ref 0.44–1.00)
GFR calc Af Amer: >60 mL/min (ref >60)
GLUCOSE: 95 mg/dL (ref 65–99)
POTASSIUM: 4 mmol/L (ref 3.5–5.1)
Sodium: 131 mmol/L — ABNORMAL LOW (ref 135–145)
Total Bilirubin: 0.7 mg/dL (ref 0.3–1.2)
Total Protein: 5.5 g/dL — ABNORMAL LOW (ref 6.5–8.1)

## 2014-10-05 LAB — LACTATE DEHYDROGENASE: LDH: 264 U/L — AB (ref 98–192)

## 2014-10-05 LAB — URIC ACID: URIC ACID, SERUM: 5.7 mg/dL (ref 2.3–6.6)

## 2014-10-05 LAB — CBC WITH DIFFERENTIAL/PLATELET
BASOS ABS: 0 10*3/uL (ref 0.0–0.1)
BASOS PCT: 0 % (ref 0–1)
Eosinophils Absolute: 0 10*3/uL (ref 0.0–0.7)
Eosinophils Relative: 0 % (ref 0–5)
HCT: 31.4 % — ABNORMAL LOW (ref 36.0–46.0)
Hemoglobin: 10.3 g/dL — ABNORMAL LOW (ref 12.0–15.0)
LYMPHS ABS: 1.7 10*3/uL (ref 0.7–4.0)
Lymphocytes Relative: 10 % — ABNORMAL LOW (ref 12–46)
MCH: 28.2 pg (ref 26.0–34.0)
MCHC: 32.8 g/dL (ref 30.0–36.0)
MCV: 86 fL (ref 78.0–100.0)
Monocytes Absolute: 1.3 10*3/uL — ABNORMAL HIGH (ref 0.1–1.0)
Monocytes Relative: 8 % (ref 3–12)
NEUTROS PCT: 82 % — AB (ref 43–77)
Neutro Abs: 13.7 10*3/uL — ABNORMAL HIGH (ref 1.7–7.7)
OTHER: 0 %
PLATELETS: 163 10*3/uL (ref 150–400)
RBC: 3.65 MIL/uL — AB (ref 3.87–5.11)
RDW: 14.7 % (ref 11.5–15.5)
WBC: 16.7 10*3/uL — AB (ref 4.0–10.5)

## 2014-10-05 MED ORDER — ZOLPIDEM TARTRATE 5 MG PO TABS: 5.0000 mg | ORAL_TABLET | Freq: Every evening | ORAL | Status: DC | PRN

## 2014-10-05 MED ORDER — MEPERIDINE HCL 25 MG/ML IJ SOLN: 6.2500 mg | INTRAMUSCULAR | Status: DC | PRN

## 2014-10-05 MED ORDER — ACETAMINOPHEN 325 MG PO TABS: 650.0000 mg | ORAL_TABLET | ORAL | Status: DC | PRN

## 2014-10-05 MED ORDER — DIPHENHYDRAMINE HCL 25 MG PO CAPS: 25.0000 mg | ORAL_CAPSULE | ORAL | Status: DC | PRN

## 2014-10-05 MED ORDER — ACETAMINOPHEN 500 MG PO TABS
1000.0000 mg | ORAL_TABLET | Freq: Four times a day (QID) | ORAL | Status: AC
  Administered 2014-10-05 (×4): 1000 mg via ORAL
  Filled 2014-10-05 (×4): qty 2

## 2014-10-05 MED ORDER — KETOROLAC TROMETHAMINE 30 MG/ML IJ SOLN: 30.0000 mg | Freq: Once | INTRAMUSCULAR | Status: DC | PRN

## 2014-10-05 MED ORDER — NALBUPHINE HCL 10 MG/ML IJ SOLN
5.0000 mg | Freq: Once | INTRAMUSCULAR | Status: DC | PRN
  Filled 2014-10-05: qty 0.5

## 2014-10-05 MED ORDER — PROMETHAZINE HCL 25 MG/ML IJ SOLN
6.2500 mg | INTRAMUSCULAR | Status: DC | PRN
Start: 2014-10-05 — End: 2014-10-05

## 2014-10-05 MED ORDER — SCOPOLAMINE 1 MG/3DAYS TD PT72
1.0000 | MEDICATED_PATCH | Freq: Once | TRANSDERMAL | Status: DC
  Filled 2014-10-05: qty 1

## 2014-10-05 MED ORDER — MAGNESIUM SULFATE 50 % IJ SOLN
2.0000 g/h | INTRAVENOUS | Status: DC
  Administered 2014-10-05: 4 g/h via INTRAVENOUS
  Filled 2014-10-05: qty 80

## 2014-10-05 MED ORDER — SODIUM CHLORIDE 0.9 % IJ SOLN: 3.0000 mL | INTRAMUSCULAR | Status: DC | PRN

## 2014-10-05 MED ORDER — LACTATED RINGERS IV SOLN
INTRAVENOUS | Status: DC
  Administered 2014-10-05: 11:00:00 via INTRAVENOUS

## 2014-10-05 MED ORDER — NALOXONE HCL 1 MG/ML IJ SOLN
1.0000 ug/kg/h | INTRAVENOUS | Status: DC | PRN
  Filled 2014-10-05: qty 2

## 2014-10-05 MED ORDER — SIMETHICONE 80 MG PO CHEW
80.0000 mg | CHEWABLE_TABLET | Freq: Three times a day (TID) | ORAL | Status: DC
  Administered 2014-10-05 – 2014-10-08 (×11): 80 mg via ORAL
  Filled 2014-10-05 (×11): qty 1

## 2014-10-05 MED ORDER — NALOXONE HCL 0.4 MG/ML IJ SOLN: 0.4000 mg | INTRAMUSCULAR | Status: DC | PRN

## 2014-10-05 MED ORDER — TETANUS-DIPHTH-ACELL PERTUSSIS 5-2.5-18.5 LF-MCG/0.5 IM SUSP
0.5000 mL | Freq: Once | INTRAMUSCULAR | Status: DC
  Filled 2014-10-05: qty 0.5

## 2014-10-05 MED ORDER — SIMETHICONE 80 MG PO CHEW
80.0000 mg | CHEWABLE_TABLET | ORAL | Status: DC
  Administered 2014-10-06 – 2014-10-08 (×3): 80 mg via ORAL
  Filled 2014-10-05 (×2): qty 1

## 2014-10-05 MED ORDER — ONDANSETRON HCL 4 MG/2ML IJ SOLN: 4.0000 mg | Freq: Three times a day (TID) | INTRAMUSCULAR | Status: DC | PRN

## 2014-10-05 MED ORDER — NALBUPHINE HCL 10 MG/ML IJ SOLN
5.0000 mg | INTRAMUSCULAR | Status: DC | PRN
  Filled 2014-10-05: qty 0.5

## 2014-10-05 MED ORDER — SIMETHICONE 80 MG PO CHEW: 80.0000 mg | CHEWABLE_TABLET | ORAL | Status: DC | PRN

## 2014-10-05 MED ORDER — MEASLES, MUMPS & RUBELLA VAC ~~LOC~~ INJ
0.5000 mL | INJECTION | Freq: Once | SUBCUTANEOUS | Status: DC
  Filled 2014-10-05: qty 0.5

## 2014-10-05 MED ORDER — HYDROMORPHONE HCL 1 MG/ML IJ SOLN
0.2500 mg | INTRAMUSCULAR | Status: DC | PRN
  Administered 2014-10-05 (×2): 0.5 mg via INTRAVENOUS

## 2014-10-05 MED ORDER — SENNOSIDES-DOCUSATE SODIUM 8.6-50 MG PO TABS
2.0000 | ORAL_TABLET | ORAL | Status: DC
  Administered 2014-10-06 – 2014-10-07 (×3): 2 via ORAL
  Filled 2014-10-05 (×3): qty 2

## 2014-10-05 MED ORDER — DIPHENHYDRAMINE HCL 25 MG PO CAPS: 25.0000 mg | ORAL_CAPSULE | Freq: Four times a day (QID) | ORAL | Status: DC | PRN

## 2014-10-05 MED ORDER — FLEET ENEMA 7-19 GM/118ML RE ENEM: 1.0000 | ENEMA | Freq: Every day | RECTAL | Status: DC | PRN

## 2014-10-05 MED ORDER — OXYCODONE-ACETAMINOPHEN 5-325 MG PO TABS: 1.0000 | ORAL_TABLET | ORAL | Status: DC | PRN

## 2014-10-05 MED ORDER — WITCH HAZEL-GLYCERIN EX PADS: 1.0000 "application " | MEDICATED_PAD | CUTANEOUS | Status: DC | PRN

## 2014-10-05 MED ORDER — DIBUCAINE 1 % RE OINT
1.0000 "application " | TOPICAL_OINTMENT | RECTAL | Status: DC | PRN
  Filled 2014-10-05: qty 28

## 2014-10-05 MED ORDER — LANOLIN HYDROUS EX OINT: 1.0000 "application " | TOPICAL_OINTMENT | CUTANEOUS | Status: DC | PRN

## 2014-10-05 MED ORDER — PRENATAL MULTIVITAMIN CH
1.0000 | ORAL_TABLET | Freq: Every day | ORAL | Status: DC
  Administered 2014-10-05 – 2014-10-08 (×4): 1 via ORAL
  Filled 2014-10-05 (×4): qty 1

## 2014-10-05 MED ORDER — IBUPROFEN 600 MG PO TABS: 600.0000 mg | ORAL_TABLET | Freq: Four times a day (QID) | ORAL | Status: DC | PRN

## 2014-10-05 MED ORDER — FERROUS SULFATE 325 (65 FE) MG PO TABS
325.0000 mg | ORAL_TABLET | Freq: Two times a day (BID) | ORAL | Status: DC
  Administered 2014-10-05 – 2014-10-08 (×7): 325 mg via ORAL
  Filled 2014-10-05 (×7): qty 1

## 2014-10-05 MED ORDER — OXYCODONE-ACETAMINOPHEN 5-325 MG PO TABS: 2.0000 | ORAL_TABLET | ORAL | Status: DC | PRN

## 2014-10-05 MED ORDER — IBUPROFEN 600 MG PO TABS
600.0000 mg | ORAL_TABLET | Freq: Four times a day (QID) | ORAL | Status: DC
  Administered 2014-10-05 – 2014-10-08 (×14): 600 mg via ORAL
  Filled 2014-10-05 (×14): qty 1

## 2014-10-05 MED ORDER — OXYTOCIN 40 UNITS IN LACTATED RINGERS INFUSION - SIMPLE MED: 62.5000 mL/h | INTRAVENOUS | Status: AC

## 2014-10-05 MED ORDER — MAGNESIUM SULFATE BOLUS VIA INFUSION
4.0000 g | Freq: Once | INTRAVENOUS | Status: DC
  Filled 2014-10-05: qty 500

## 2014-10-05 MED ORDER — HYDROMORPHONE HCL 1 MG/ML IJ SOLN
INTRAMUSCULAR | Status: AC
  Filled 2014-10-05: qty 1

## 2014-10-05 MED ORDER — BISACODYL 10 MG RE SUPP
10.0000 mg | Freq: Every day | RECTAL | Status: DC | PRN
  Filled 2014-10-05: qty 1

## 2014-10-05 MED ORDER — DIPHENHYDRAMINE HCL 50 MG/ML IJ SOLN: 12.5000 mg | INTRAMUSCULAR | Status: DC | PRN

## 2014-10-05 MED ORDER — MENTHOL 3 MG MT LOZG
1.0000 | LOZENGE | OROMUCOSAL | Status: DC | PRN
  Filled 2014-10-05: qty 9

## 2014-10-05 NOTE — Anesthesia Postprocedure Evaluation (Signed)
Anesthesia Post Note  Patient: [name]  Procedure(s) Performed: Procedure(s) (LRB): CESAREAN SECTION (N/A)  Anesthesia type: Epidural  Patient location: Mother/Baby  Post pain: Pain level controlled  Post assessment: Post-op Vital signs reviewed  Last Vitals:  Filed Vitals:   10/05/14 0700  BP: 140/74  Pulse:   Temp:   Resp: 18    Post vital signs: Reviewed  Level of consciousness:alert  Complications: No apparent anesthesia complications

## 2014-10-05 NOTE — Addendum Note (Signed)
Addendum  created 10/05/14 0800 by Graciela Husbands, CRNA   Modules edited: Notes Section   Notes Section:  File: [id number]

## 2014-10-05 NOTE — Lactation Note (Signed)
This note was copied from the chart of Taylor Shelda Altes. Lactation Consultation Note Follow up visit at 21 hours of age. Mom is having difficulty latching baby.  Baby had a circ around 1600 today and is now STS with mom showing feeding cues.  Assisted with football hold on right breast.  Mom has flat nipples, hand pump used to help evert nipples.  Baby latched well with wide flanged lips and sucking bursts, good jaw movement noted, but no audible swallows at this time.  Baby maintained feeding for several minutes and continues.  [facility] LC resources given and discussed.  Encouraged to feed with early cues on demand.  Early newborn behavior discussed.  Hand expression demonstrated with colostrum visible. FOB at bedside supportive.   Mom to call for assist as needed.    Patient Name: [name] [id number] Date: 10/05/2014 Reason for consult: Follow-up assessment   Maternal Data    Feeding Feeding Type: Breast Fed Length of feed:  (several minutes observed)  LATCH Score/Interventions Latch: Repeated attempts needed to sustain latch, nipple held in mouth throughout feeding, stimulation needed to elicit sucking reflex. Intervention(s): Skin to skin;Teach feeding cues;Waking techniques Intervention(s): Breast compression;Breast massage;Assist with latch;Adjust position  Audible Swallowing: A few with stimulation Intervention(s): Skin to skin;Hand expression  Type of Nipple: Flat Intervention(s): Hand pump;Double electric pump  Comfort (Breast/Nipple): Soft / non-tender     Hold (Positioning): Assistance needed to correctly position infant at breast and maintain latch. Intervention(s): Breastfeeding basics reviewed;Support Pillows;Position options;Skin to skin  LATCH Score: 6  Lactation Tools Discussed/Used     Consult Status Consult Status: Follow-up Date: 10/06/14 Follow-up type: In-patient    Jannifer Rodney 10/05/2014, 9:03 PM

## 2014-10-05 NOTE — Op Note (Signed)
Cesarean Section Procedure Note   [name]  10/04/2014 - 10/05/2014  Indications: fetal intolerance to labor.  preeclampsia.  failure to progress   Pre-operative Diagnosis: CESAREAN SECTION.   Post-operative Diagnosis: Same   Surgeon: Surgeon(s) and Role:    * Jaymes Graff, MD - Primary   Assistants: K. Mayford Knife CNM   Anesthesia: epidural   Procedure Details:  The patient was seen in the Holding Room. The risks, benefits, complications, treatment options, and expected outcomes were discussed with the patient. The patient concurred with the proposed plan, giving informed consent. identified as [name] and the procedure verified as C-Section Delivery. A Time Out was held and the above information confirmed.  After induction of anesthesia, the patient was draped and prepped in the usual sterile manner. A transverse incision was made and carried down through the subcutaneous tissue to the fascia. Fascial incision was made in the midline and extended transversely. The fascia was separated from the underlying rectus muscle superiorly and inferiorly. The peritoneum was identified and entered. Peritoneal incision was extended longitudinally with good visualization of bowel and bladder. The utero-vesical peritoneal reflection was incised transversely and the bladder flap was bluntly freed from the lower uterine segment.  An alexsis retractor was placed in the abdomen.   A low transverse uterine incision was made. Delivered from cephalic presentation was a  infant, with Apgar scores of 8 at one minute and 9 at five minutes. Cord ph was not sent the umbilical cord was clamped and cut cord blood was obtained for evaluation. The placenta was removed Intact and appeared normal. The uterine outline, tubes and ovaries appeared normal}. The uterine incision was closed with running locked sutures of 0Vicryl. A second layer 0 vicrlyl was used to imbricate the uterine incision    Hemostasis was observed.  Lavage was carried out until clear. The alexsis was removed.  The peritoneum was closed with 0 chromic.  The muscles were examined and any bleeders were made hemostatic using bovie cautery device.   The fascia was then reapproximated with running sutures of 0 vicryl.  The subcutaneous tissue was reapproximated  With interrupted stitches using 2-0 plain gut. The subcuticular closure was performed using 3-89monocryl. sugicel was placed along the uterine incision.  Before the procedure was started there was bloody urine noted.  The bladder was distended when i entered the peritoneal cavity.  The bladder was retrogradely filled with sterile milk.  No milk was seen in the abdomen.  20 cc 2% lidocaine was placed in the SQ because the pt complained of pain in that area.       Instrument, sponge, and needle counts were correct prior the abdominal closure and were correct at the conclusion of the case.    Findings: infant was delivered from vtx presentation. The fluid was clear.  The uterus tubes and ovaries appeared normal.     Estimated Blood Loss:   Total IV Fluids:   Urine Output: 200CC OF bloody urine  Specimens: placenta  Complications: no complications  Disposition: PACU - hemodynamically stable.   Maternal Condition: stable   Baby condition / location:  Couplet care / Skin to Skin  Attending Attestation: I performed the procedure.   Signed: Surgeon(s): Jaymes Graff, MD

## 2014-10-05 NOTE — Anesthesia Postprocedure Evaluation (Signed)
Anesthesia Post Note  Patient: [name]  Procedure(s) Performed: Procedure(s) (LRB): CESAREAN SECTION (N/A)  Anesthesia type: Epidural  Patient location: PACU  Post pain: Pain level controlled  Post assessment: Post-op Vital signs reviewed  Last Vitals:  Filed Vitals:   10/05/14 0030  BP: 171/94  Pulse: 111  Temp:   Resp: 20    Post vital signs: Reviewed  Level of consciousness: awake  Complications: No apparent anesthesia complications

## 2014-10-05 NOTE — Transfer of Care (Signed)
Immediate Anesthesia Transfer of Care Note  Patient: [name]  Procedure(s) Performed: Procedure(s): CESAREAN SECTION (N/A)  Patient Location: PACU  Anesthesia Type:Epidural  Level of Consciousness: awake, alert  and oriented  Airway & Oxygen Therapy: Patient Spontanous Breathing  Post-op Assessment: Report given to RN and Post -op Vital signs reviewed and stable  Post vital signs: Reviewed and stable  Last Vitals:  Filed Vitals:   10/04/14 2202  BP: 148/90  Pulse: 102  Temp: 37.3 C  Resp:     Complications: No apparent anesthesia complications

## 2014-10-05 NOTE — Lactation Note (Signed)
This note was copied from the chart of [name]. Lactation Consultation Note  Mom is on Magnesium Sulfate.  Baby was cueing while in the crib but fell asleep once he was at the breast.  Drops of colostrum were placed on his lips but he displayed no interest in eating.  Mom has a history of Lumpectomy on the right upper inner quadrant. Explained to her that there was a chance that this breast may make less milk than the opposite side but that there was no way to predict this. Reassured that overall her MS would be adequate.  Nipple and areola become erect with stimulation but do not maintain.  Follow-up planned. Aware of support groups and outpatient services.  Patient Name: Taylor Shontavia Mickel Today's Date: 10/05/2014 Reason for consult: Initial assessment   Maternal Data Has patient been taught Hand Expression?: Yes Does the patient have breastfeeding experience prior to this delivery?: No  Feeding Feeding Type: Breast Fed  LATCH Score/Interventions Latch: Too sleepy or reluctant, no latch achieved, no sucking elicited.  Audible Swallowing: None Intervention(s): Hand expression;Skin to skin  Type of Nipple: Flat Intervention(s):  (manual manipulation)  Comfort (Breast/Nipple): Soft / non-tender     Hold (Positioning): Assistance needed to correctly position infant at breast and maintain latch. Intervention(s): Breastfeeding basics reviewed;Support Pillows  LATCH Score: 4  Lactation Tools Discussed/Used Initiated by:: RN Date initiated:: 10/05/14   Consult Status Consult Status: Follow-up Date: 10/06/14 Follow-up type: In-patient    Soyla Dryer 10/05/2014, 12:44 PM

## 2014-10-05 NOTE — Progress Notes (Addendum)
[name] [id number]  Subjective: Postpartum Day 0: Primary LTC/S due to NRFHR, FTP, pre-eclampsia Patient not up yet.  Sitting up without syncope or dizziness. Feeding:  Breast Contraceptive plan:  Undecided  Objective: Temp:  [97.4 F (36.3 C)-99.3 F (37.4 C)] 98 F (36.7 C) (09/06 0813) Pulse Rate:  [89-127] 97 (09/06 0905) Resp:  [17-20] 18 (09/06 0813) BP: (118-171)/(59-127) 144/84 mmHg (09/06 0905) SpO2:  [92 %-100 %] 97 % (09/06 0813) Weight:  [107.321 kg (236 lb 9.6 oz)] 107.321 kg (236 lb 9.6 oz) (09/06 0134)   Filed Vitals:   10/05/14 0628 10/05/14 0700 10/05/14 0813 10/05/14 0905  BP:  140/74 128/66 144/84  Pulse:   103 97  Temp:   98 F (36.7 C)   TempSrc:   Oral   Resp: Height:      Weight:      SpO2: 94% 93% 97%    CBC Latest Ref Rng 10/05/2014 10/05/2014 10/04/2014  WBC 4.0 - 10.5 K/uL 16.7(H) 15.6(H) 9.5  Hemoglobin 12.0 - 15.0 g/dL 10.3(L) 10.6(L) 11.6(L)  Hematocrit 36.0 - 46.0 % 31.4(L) 32.3(L) 34.3(L)  Platelets 150 - 400 K/uL 163 137(L) 161   CMP Latest Ref Rng 10/05/2014 10/04/2014  Glucose 65 - 99 mg/dL 95 88  BUN 6 - 20 mg/dL 8 9  Creatinine 2.84 - 1.00 mg/dL 1.32 4.40  Sodium 102 - 145 mmol/L 131(L) 134(L)  Potassium 3.5 - 5.1 mmol/L 4.0 4.1  Chloride 101 - 111 mmol/L 97(L) 105  CO2 22 - 32 mmol/L 22 22  Calcium 8.9 - 10.3 mg/dL 7.2(Z) 9.2  Total Protein 6.5 - 8.1 g/dL 3.6(U) 6.4(L)  Total Bilirubin 0.3 - 1.2 mg/dL 0.7 0.4  Alkaline Phos 38 - 126 U/L 114 147(H)  AST 15 - 41 U/L 27 24  ALT 14 - 54 U/L 11(L) 12(L)   LDH 264 Uric acid 5.7  I/O balance last 24 hours = +599 Output this shift 250 cc.  On Magnesium since 0058--now 2 gm/hr.  Physical Exam:  General: alert Lochia: appropriate Uterine Fundus: firm Abdomen:  + bowel sounds Incision: Honeycomb dressing CDI DVT Evaluation: No evidence of DVT seen on physical exam. Negative Homan's sign. Calf/Ankle edema is present. 1+. Foley draining clear  urine.  Assessment/Plan: Status post cesarean delivery, day 0--FTP, NRFHR, pre-eclampsia without severe features. Anemia Stable Orthostatic VS Fe BID Recheck CBC tomorrow. Continue current care. Anticipate d/c of magnesium at 0058, if BPs stable.    Nigel Bridgeman MSN, CNM 10/05/2014, 11:51 AM   Seen and agreed Urine output 300 cc/hr Patient denies sx of pre-eclampsia Will d/c MgSO4 at 1:00 am

## 2014-10-06 LAB — CBC
HEMATOCRIT: 26.1 % — AB (ref 36.0–46.0)
HEMOGLOBIN: 8.6 g/dL — AB (ref 12.0–15.0)
MCH: 28.5 pg (ref 26.0–34.0)
MCHC: 33 g/dL (ref 30.0–36.0)
MCV: 86.4 fL (ref 78.0–100.0)
Platelets: 152 10*3/uL (ref 150–400)
RBC: 3.02 MIL/uL — AB (ref 3.87–5.11)
RDW: 15.2 % (ref 11.5–15.5)
WBC: 12.3 10*3/uL — ABNORMAL HIGH (ref 4.0–10.5)

## 2014-10-06 LAB — LACTATE DEHYDROGENASE: LDH: 265 U/L — ABNORMAL HIGH (ref 98–192)

## 2014-10-06 LAB — URIC ACID: Uric Acid, Serum: 6 mg/dL (ref 2.3–6.6)

## 2014-10-06 MED ORDER — LABETALOL HCL 100 MG PO TABS
100.0000 mg | ORAL_TABLET | Freq: Two times a day (BID) | ORAL | Status: DC
  Administered 2014-10-06 – 2014-10-08 (×4): 100 mg via ORAL
  Filled 2014-10-06 (×6): qty 1

## 2014-10-06 MED ORDER — LABETALOL HCL 100 MG PO TABS
100.0000 mg | ORAL_TABLET | Freq: Two times a day (BID) | ORAL | Status: DC
  Administered 2014-10-06: 100 mg via ORAL
  Filled 2014-10-06 (×2): qty 1

## 2014-10-06 NOTE — Lactation Note (Signed)
This note was copied from the chart of Taylor Shelda Altes. Lactation Consultation Note- Follow up visit in moms room. Mom C/O pain with latch. No abrasions or bruising noted. Infant awake, crying and rooting. Mom had just attempted to latch infant by self with out success. Mom has large flat nipples. Infant vigorous at breast. Opened mouth wide to latch, latched shallowly with lips curled in with latch. Showed parents how to flange infant lips. Mom with nipple pain with latch that did correct with positioning and lip flanging. Worked with parents on positioning baby at the level of the breast as she was not using pillows for support. Baby actively nursed with audible and visible swallows. Discussed normalcy of cluster feedings. Mom drowsy with feeding, encouraged her to BF in bed with side rails up or in recliner that is reclined in case of falling asleep. Parents voiced understanding of BF basics taught, encouraged them to call for assistance with latch as needed. Encouraged use of comfort gels and expressed colostrum with air drying post feeds. Mom felt better after feeding assistance, she said that she is almost at point of pumping and bottle feeding due to pain with patch, encouragement given.   Patient Name: [name] [id number] Date: 10/06/2014 Reason for consult: Follow-up assessment   Maternal Data    Feeding Feeding Type: Breast Fed Length of feed: 20 min  LATCH Score/Interventions Latch: Repeated attempts needed to sustain latch, nipple held in mouth throughout feeding, stimulation needed to elicit sucking reflex. Intervention(s): Skin to skin Intervention(s): Adjust position;Assist with latch;Breast compression  Audible Swallowing: Spontaneous and intermittent Intervention(s): Skin to skin  Type of Nipple: Flat  Comfort (Breast/Nipple): Filling, red/small blisters or bruises, mild/mod discomfort  Problem noted: Mild/Moderate discomfort Interventions (Mild/moderate  discomfort): Comfort gels  Hold (Positioning): Assistance needed to correctly position infant at breast and maintain latch. Intervention(s): Breastfeeding basics reviewed;Support Pillows;Skin to skin  LATCH Score: 6  Lactation Tools Discussed/Used Tools: Comfort gels   Consult Status Consult Status: Follow-up Date: 10/07/14 Follow-up type: In-patient    Silas Flood Ketzia Guzek 10/06/2014, 2:07 PM

## 2014-10-06 NOTE — SANE Note (Addendum)
Subjective: Postpartum Day 2: Cesarean Delivery due to repeat Patient up ad lib, reports no syncope, dizziness, HA or chest pain.  Feeding:  breast Contraceptive plan:  unsure  Objective: Vital signs in last 24 hours: Temp:  [98.2 F (36.8 C)-99 F (37.2 C)] 99 F (37.2 C) (09/07 0831) Pulse Rate:  [25-130] 84 (09/07 0831) Resp:  [18-20] 20 (09/07 0831) BP: (119-155)/(58-83) 130/69 mmHg (09/07 0831) SpO2:  [92 %-99 %] 98 % (09/07 0831)   Filed Vitals:   10/06/14 7829 10/06/14 0505 10/06/14 0643 10/06/14 0831  BP: 131/69 144/79 134/72 130/69  Pulse: 97 92 90 84  Temp:    99 F (37.2 C)  TempSrc:    Oral  Resp: Height:      Weight:      SpO2:    98%   Results for orders placed or performed during the hospital encounter of 10/04/14 (from the past 24 hour(s))  Lactate dehydrogenase     Status: Abnormal   Collection Time: 10/06/14  7:30 AM  Result Value Ref Range   LDH 265 (H) 98 - 192 U/L  Uric acid     Status: None   Collection Time: 10/06/14  7:30 AM  Result Value Ref Range   Uric Acid, Serum 6.0 2.3 - 6.6 mg/dL  CBC     Status: Abnormal   Collection Time: 10/06/14  7:30 AM  Result Value Ref Range   WBC 12.3 (H) 4.0 - 10.5 K/uL   RBC 3.02 (L) 3.87 - 5.11 MIL/uL   Hemoglobin 8.6 (L) 12.0 - 15.0 g/dL   HCT 56.2 (L) 13.0 - 86.5 %   MCV 86.4 78.0 - 100.0 fL   MCH 28.5 26.0 - 34.0 pg   MCHC 33.0 30.0 - 36.0 g/dL   RDW 78.4 69.6 - 29.5 %   Platelets 152 150 - 400 K/uL   Physical Exam:  General: alert and cooperative Lochia: appropriate Uterine Fundus: firm Abdomen:  + bowel sounds, non distended Incision: healing well  Honeycomb dressing CDI DVT Evaluation: No evidence of DVT seen on physical exam. Homan's sign: Negative   Recent Labs  10/05/14 0035 10/05/14 0640 10/06/14 0730  HGB 10.6* 10.3* 8.6*  HCT 32.3* 31.4* 26.1*  WBC 15.6* 16.7* 12.3*   Assessment: Status post Cesarean section day 2. Doing well postoperatively.  SP Mag   Honeycomb dressing in place, no significant new drainage Anemia - hemodynamicly stable.   Circumcision: in patient  Plan: Continue current care. Plan for discharge tomorrow and Breastfeeding Remove outer dressing    Venus Standard, CNM, MSN 10/06/2014. 11:01 AM

## 2014-10-07 NOTE — Clinical Social Work Maternal (Signed)
CLINICAL SOCIAL WORK MATERNAL/CHILD NOTE  Patient Details  Name: [name] MRN: [id number] Date of Birth: 05/17/1985  Date:  2015-01-12  Clinical Social Worker Initiating Note:  Loleta Books, LCSW Date/ Time Initiated:  10/07/14/0900     Child's Name:  [name]   Legal Guardian:  Benetta Spar and Antionette Char  Need for Interpreter:  None   Date of Referral:  21-Mar-2014     Reason for Referral: Concern for PPD due to being emotional and tearful  Referral Source:  Physician   Address:  [address] [place], [place] [address]  Phone number:  [phone]   Household Members:  Spouse   Natural Supports (not living in the home):  Extended Family, Immediate Family   Professional Supports: None   Employment: Full-time   Type of Work: Runner, broadcasting/film/video   Education:    N/A  Surveyor, quantity Resources:  Media planner   Other Resources:    N/A  Cultural/Religious Considerations Which May Impact Care:  None reported  Strengths:  Ability to meet basic needs , Pediatrician chosen , Home prepared for child    Risk Factors/Current Problems:   1) Unanticipated change in birth plan.  MOB continues to process and cope with feelings of loss associated with expectations, change in birth plan, and the challenges she has encountered with breastfeeding.   Cognitive State:  Able to Concentrate , Alert , Linear Thinking , Insightful    Mood/Affect:  Tearful , Happy , Interested    CSW Assessment:  CSW received request for consult due to MOB presenting as emotional and tearful postpartum, with providers identifying concerns for risk for postpartum depression.  MOB presented as easily engaged and receptive to the visit. MOB provided consent for the FOB to remain in the room during the assessment. FOB presented as supportive, and actively participated in the assessment.  MOB became emotional and tearful as she discussed her feelings after birth, but she presents with insight,  self-awareness, and demonstrated ability to engage in cognitive techniques that will continue to assist her to cope/adjust postpartum.   CSW assisted the MOB to continue to process her thoughts and feelings as she transitions postpartum. MOB continues to cope with the change in her birth plan. She stated that she had wanted to have a water birth, but she was never able to enter the water due to the infant's heart rate and her blood pressure.  MOB discussed feelings of loss since she was unable to have a water birth or a vaginal birth since she required a C-section.  MOB expressed fear that it will negatively impact her bonding, and shared belief that the change in birth plan and the change in bond may be making it difficult for her to breastfeed.  MOB stated that she had been looking forward to breastfeeding, but is noting that she is overwhelmed and not looking forward to initiating feedings since it is painful and she is concerned that the infant is not getting enough food.  MOB shared that breastfeeding triggers feelings of anxiety, despite knowing that the infant is getting enough since he is "peeing and pooping".    The FOB shared that it can be difficult emotionally for the MOB when there are changes in plan she is a perfectionistic and organized.  The MOB stated that the FOB has been helpful and supportive since he is reminding her that the despite the change in birth plans, the infant is alive and healthy.  With guidance ,MOB is also able to  identify all the positive ways she has been able to bond and care for the infant. MOB smiled as she recognized how the infant recognizes her and that she is able to care for the infant in unique ways.  MOB stated that she is attempting to remind herself as well that breastfeeding can be difficult and challenging for anyone, regardless of the method in which the infant was born.  MOB presents with insight and self-awareness related to how the infant's birth experience  has impacted her current mental health, and presented as receptive to CSW recommendations to continue to discuss her feelings as they arise postpartum.    CSW continued to provide education on perinatal mood and anxiety disorders, including heightened risk due to unanticipated change in her birth plan.  MOB denied mental health concerns during the pregnancy.  She endorsed having strong family support, but shared that she is unsure "what it will be like" postpartum if she needs to ask for help.  MOB unable to clarify the content of this statement since the infant began to cry.  MOB receptive to recommendations for non-clinical interventions that support mental health, including sleep, healthy diet, and exercise.  MOB agreed to contact her OB provider if she notes symptoms postpartum.    MOB and FOB denied additional questions, concerns, or needs at this time. She expressed appreciation for the visit, and agreed to contact CSW if additional needs arise.  CSW Plan/Description:   1)Patient/Family Education: Perinatal mood and anxiety disorders 2)Information/Referral to [employer] Resources: Feelings After Birth Support group 3)No Further Intervention Required/No Barriers to Discharge    Kelby Fam Oct 05, 2014, 12:11 PM

## 2014-10-07 NOTE — Lactation Note (Signed)
This note was copied from the chart of Taylor Shelda Altes. Lactation Consultation Note; mother paged for assistance with infant feeding. When i arrived in mothers room infant was latched with a shallow latch . Mother was crying and states that she was having a painful latch. Mother states that infant has been feeding a lot for long periods since early am. . Observed mother's position and assist with better support and deeper latch. Mother continued to cry and state that pain was still present after assisting with depth. I offered to assist with hand expression and feed infant with a spoon. Infant was fed approximately 10-12 ml ebm with a spoon. Mother assist with pumping using the DEBP. Advised mother to pump on low setting for 15-20 mins every 2-3 hours. Mother was given AAP guidelines to follow for feeding infant. Mother post pumped 3-4 ml. Advised mother to supplement with additional formula if needed. Mother has comfort gels. Discussed methods, spoon, cup ,curved tip syringe or bottle nipple  for supplementing. Advised mother to pump for 1-2 feedings to get rest and allow break for her nipples. Mother receptive to all teaching. Support and encouragement given. Father at bedside with infant sleeping STS.  Patient Name: [name] Today's Date: 10/07/2014     Maternal Data    Feeding    LATCH Score/Interventions                      Lactation Tools Discussed/Used     Consult Status      [name] 10/07/2014, 4:49 PM

## 2014-10-07 NOTE — Plan of Care (Signed)
Problem: Discharge Progression Outcomes Goal: Barriers To Progression Addressed/Resolved Outcome: Progressing PIH- on Mag until 9/7 ~ 0100

## 2014-10-07 NOTE — Progress Notes (Signed)
Subjective: Postpartum Day 2: Cesarean Delivery due to NRFHT Patient up ad lib, reports no syncope or dizziness. Feeding:  breast Contraceptive plan:  Imogene Burn, and unsure of breastfeeding.  She report she gets overwhelmed during feeding and when the baby crys  Objective: Vital signs in last 24 hours: Temp:  [98.6 F (37 C)-99.4 F (37.4 C)] 98.8 F (37.1 C) (09/08 0533) Pulse Rate:  [73-104] 95 (09/08 0533) Resp:  [18-20] 18 (09/08 0533) BP: (123-152)/(68-77) 140/68 mmHg (09/08 0533) SpO2:  [97 %-99 %] 99 % (09/07 2158) Weight:  [230 lb 4.8 oz (104.463 kg)] 230 lb 4.8 oz (104.463 kg) (09/07 1220)  Physical Exam:  General: alert and cooperative Lochia: appropriate Uterine Fundus: firm Abdomen:  + bowel sounds, non distended Incision: healing well  Honeycomb dressing CDI DVT Evaluation: No evidence of DVT seen on physical exam. Homan's sign: Negative   Recent Labs  10/05/14 0035 10/05/14 0640 10/06/14 0730  HGB 10.6* 10.3* 8.6*  HCT 32.3* 31.4* 26.1*  WBC 15.6* 16.7* 12.3*    Assessment: Status post Cesarean section day 2. Doing well postoperatively.  no significant drainage Anemia - hemodynamicly stable.   Circumcision: in patient   Plan: Continue current care. Plan for discharge tomorrow, Lactation consult, Social Work consult and Circumcision prior to discharge     [name], CNM, MSN 10/07/2014. 9:19 AM

## 2014-10-08 MED ORDER — OXYCODONE-ACETAMINOPHEN 5-325 MG PO TABS: 1.0000 | ORAL_TABLET | ORAL | Status: DC | PRN

## 2014-10-08 MED ORDER — LABETALOL HCL 100 MG PO TABS: 100.0000 mg | ORAL_TABLET | Freq: Two times a day (BID) | ORAL | Status: DC

## 2014-10-08 MED ORDER — FERROUS SULFATE 325 (65 FE) MG PO TABS: 325.0000 mg | ORAL_TABLET | Freq: Two times a day (BID) | ORAL | Status: DC

## 2014-10-08 MED ORDER — IBUPROFEN 600 MG PO TABS: 600.0000 mg | ORAL_TABLET | Freq: Four times a day (QID) | ORAL | Status: DC | PRN

## 2014-10-08 NOTE — Discharge Instructions (Signed)
Cesarean Delivery  Cesarean delivery is the birth of a baby through a cut (incision) in the abdomen and womb (uterus).  [facility] KNOW ABOUT:  All medicines you are taking, including vitamins, herbs, eye drops, creams, and over-the-counter medicines.  Previous problems you or members of your family have had with the use of anesthetics.  Any blood disorders you have.  Previous surgeries you have had.  Medical conditions you have.  Any allergies you have.  Complicationsinvolving the pregnancy. RISKS AND COMPLICATIONS  Generally, this is a safe procedure. However, as with any procedure, complications can occur. Possible complications include:  Bleeding.  Infection.  Blood clots.  Injury to surrounding organs.  Problems with anesthesia.  Injury to the baby. BEFORE THE PROCEDURE   You may be given an antacid medicine to drink. This will prevent acid contents in your stomach from going into your lungs if you vomit during the surgery.  You may be given an antibiotic medicine to prevent infection. PROCEDURE   Hair may be removed from your pubic area and your lower abdomen. This is to prevent infection in the incision site.  A tube (Foley catheter) will be placed in your bladder to drain your urine from your bladder into a bag. This keeps your bladder empty during surgery.  An IV tube will be placed in your vein.  You may be given medicine to numb the lower half of your body (regional anesthetic). If you were in labor, you may have already had an epidural in place which can be used in both labor and cesarean delivery. You may possibly be given medicine to make you sleep (general anesthetic) though this is not as common.  An incision will be made in your abdomen that extends to your uterus. There are 2 basic kinds of incisions:  The horizontal (transverse) incision. Horizontal incisions are from side to side and are used for most routine cesarean  deliveries.  The vertical incision. The vertical incision is from the top of the abdomen to the bottom and is less commonly used. It is often done for women who have a serious complication (extreme prematurity) or under emergency situations.  The horizontal and vertical incisions may both be used at the same time. However, this is very uncommon.  An incision is then made in your uterus to deliver the baby.  Your baby will then be delivered.  Both incisions are then closed with absorbable stitches. AFTER THE PROCEDURE   If you were awake during the surgery, you will see your baby right away. If you were asleep, you will see your baby as soon as you are awake.  You may breastfeed your baby after surgery.  You may be able to get up and walk the same day as the surgery. If you need to stay in bed for a period of time, you will receive help to turn, cough, and take deep breaths after surgery. This helps prevent lung problems such as pneumonia.  Do not get out of bed alone the first time after surgery. You will need help getting out of bed until you are able to do this by yourself.  You may be able to shower the day after your cesarean delivery. After the bandage (dressing) is taken off the incision site, a nurse will assist you to shower if you would like help.  You will have pneumatic compression hose placed on your lower legs. This is done to prevent blood clots. When you are up  and walking regularly, they will no longer be necessary.  Do not cross your legs when you sit.  Save any blood clots that you pass. If you pass a clot while on the toilet, do not flush it. Call for the nurse. Tell the nurse if you think you are bleeding too much or passing too many clots.  You will be given medicine as needed. Let your health care providers know if you are hurting. You may also be given an antibiotic to prevent an infection.  Your IV tube will be taken out when you are drinking a reasonable  amount of fluids. The Foley catheter is taken out when you are up and walking.  If your blood type is Rh negative and your baby's blood type is Rh positive, you will be given a shot of anti-D immune globulin. This shot prevents you from having Rh problems with a future pregnancy. You should get the shot even if you had your tubes tied (tubal ligation).  If you are allowed to take the baby for a walk, place the baby in the bassinet and push it. Do not carry your baby in your arms. Document Released: 01/15/2005 Document Revised: 11/05/2012 Document Reviewed: 08/06/2012 [facility] Patient Information 2015 [place], [place]. This information is not intended to replace advice given to you by your health care provider. Make sure you discuss any questions you have with your health care provider. Hypertension During Pregnancy Hypertension, or high blood pressure, is when there is extra pressure inside your blood vessels that carry blood from the heart to the rest of your body (arteries). It can happen at any time in life, including pregnancy. Hypertension during pregnancy can cause problems for you and your baby. Your baby might not weigh as much as he or she should at birth or might be born early (premature). Very bad cases of hypertension during pregnancy can be life-threatening.  Different types of hypertension can occur during pregnancy. These include:  Chronic hypertension. This happens when a woman has hypertension before pregnancy and it continues during pregnancy.  Gestational hypertension. This is when hypertension develops during pregnancy.  Preeclampsia or toxemia of pregnancy. This is a very serious type of hypertension that develops only during pregnancy. It affects the whole body and can be very dangerous for both mother and baby.  Gestational hypertension and preeclampsia usually go away after your baby is born. Your blood pressure will likely stabilize within 6 weeks. Women who have hypertension  during pregnancy have a greater chance of developing hypertension later in life or with future pregnancies. RISK FACTORS There are certain factors that make it more likely for you to develop hypertension during pregnancy. These include:  Having hypertension before pregnancy.  Having hypertension during a previous pregnancy.  Being overweight.  Being older than 40 years.  Being pregnant with more than one baby.  Having diabetes or kidney problems. SIGNS AND SYMPTOMS Chronic and gestational hypertension rarely cause symptoms. Preeclampsia has symptoms, which may include:  Increased protein in your urine. Your health care provider will check for this at every prenatal visit.  Swelling of your hands and face.  Rapid weight gain.  Headaches.  Visual changes.  Being bothered by light.  Abdominal pain, especially in the upper right area.  Chest pain.  Shortness of breath.  Increased reflexes.  Seizures. These occur with a more severe form of preeclampsia, called eclampsia. DIAGNOSIS  You may be diagnosed with hypertension during a regular prenatal exam. At each prenatal visit, you  may have:  Your blood pressure checked.  A urine test to check for protein in your urine. The type of hypertension you are diagnosed with depends on when you developed it. It also depends on your specific blood pressure reading.  Developing hypertension before 20 weeks of pregnancy is consistent with chronic hypertension.  Developing hypertension after 20 weeks of pregnancy is consistent with gestational hypertension.  Hypertension with increased urinary protein is diagnosed as preeclampsia.  Blood pressure measurements that stay above 160 systolic or 110 diastolic are a sign of severe preeclampsia. TREATMENT Treatment for hypertension during pregnancy varies. Treatment depends on the type of hypertension and how serious it is.  If you take medicine for chronic hypertension, you may need to  switch medicines.  Medicines called ACE inhibitors should not be taken during pregnancy.  Low-dose aspirin may be suggested for women who have risk factors for preeclampsia.  If you have gestational hypertension, you may need to take a blood pressure medicine that is safe during pregnancy. Your health care provider will recommend the correct medicine.  If you have severe preeclampsia, you may need to be in the hospital. Health care providers will watch you and your baby very closely. You also may need to take medicine called magnesium sulfate to prevent seizures and lower blood pressure.  Sometimes, an early delivery is needed. This may be the case if the condition worsens. It would be done to protect you and your baby. The only cure for preeclampsia is delivery.  Your health care provider may recommend that you take one low-dose aspirin (81 mg) each day to help prevent high blood pressure during your pregnancy if you are at risk for preeclampsia. You may be at risk for preeclampsia if:  You had preeclampsia or eclampsia during a previous pregnancy.  Your baby did not grow as expected during a previous pregnancy.  You experienced preterm birth with a previous pregnancy.  You experienced a separation of the placenta from the uterus (placental abruption) during a previous pregnancy.  You experienced the loss of your baby during a previous pregnancy.  You are pregnant with more than one baby.  You have other medical conditions, such as diabetes or an autoimmune disease. HOME CARE INSTRUCTIONS  Schedule and keep all of your regular prenatal care appointments. This is important.  Take medicines only as directed by your health care provider. Tell your health care provider about all medicines you take.  Eat as little salt as possible.  Get regular exercise.  Do not drink alcohol.  Do not use tobacco products.  Do not drink products with caffeine.  Lie on your left side when  resting. SEEK IMMEDIATE MEDICAL CARE IF:  You have severe abdominal pain.  You have sudden swelling in your hands, ankles, or face.  You gain 4 pounds (1.8 kg) or more in 1 week.  You vomit repeatedly.  You have vaginal bleeding.  You do not feel your baby moving as much.  You have a headache.  You have blurred or double vision.  You have muscle twitching or spasms.  You have shortness of breath.  You have blue fingernails or lips.  You have blood in your urine. MAKE SURE YOU:  Understand these instructions.  Will watch your condition.  Will get help right away if you are not doing well or get worse. Document Released: 10/03/2010 Document Revised: 06/01/2013 Document Reviewed: 08/14/2012 [facility] Patient Information 2015 [place], [place]. This information is not intended to replace advice given  to you by your health care provider. Make sure you discuss any questions you have with your health care provider.

## 2014-10-08 NOTE — Discharge Summary (Signed)
Obstetric Discharge Summary Reason for Admission: onset of labor and Preeclampsia Prenatal Procedures: NST, Preeclampsia and ultrasound Intrapartum Procedures: cesarean: low cervical, transverse Postpartum Procedures: antibiotics Complications-Operative and Postpartum: none HEMOGLOBIN  Date Value Ref Range Status  10/06/2014 8.6* 12.0 - 15.0 g/dL Final  78/29/5621 30.8 g/dL Final   HCT  Date Value Ref Range Status  10/06/2014 26.1* 36.0 - 46.0 % Final    Physical Exam:  General: alert and cooperative Lochia: appropriate Uterine Fundus: firm Incision: healing well, no significant drainage DVT Evaluation: No evidence of DVT seen on physical exam.  Discharge Diagnoses: Term Pregnancy-delivered and Preelampsia . Hospital Course:   The patient came in labor and had a CS for CTX by Jaymes Graff MD.  Post operatively she did well.  She tolerated a regular diet and her exam is WNL and documented in the chart. .  She has recovered well and is ready for discharge.  She is  Breast feeding and will use unsusur for [facility].  She was diagnosed with preeclampsia and received magnesium in the hospital   Discharge Information: Date: 10/08/2014 Activity: pelvic rest Diet: routine Medications: PNV, Ibuprofen, Percocet and Labetalol Condition: stable Instructions: refer to practice specific booklet Discharge to: home Follow-up Information    Follow up with [facility] A, MD. Schedule an appointment as soon as possible for a visit in 6 weeks.   Specialty:  Obstetrics and Gynecology   Contact information:   [address] [place] [address] [phone]       Newborn Data: Live born female  Birth Weight: 8 lb 11.5 oz (3955 g) APGAR: 8, 9  Home with mother.  Lyndzie Zentz A 10/08/2014, 12:37 PM

## 2014-10-08 NOTE — Lactation Note (Signed)
This note was copied from the chart of Taylor Shelda Altes. Lactation Consultation Note  Follow up with mom prior to d/c. Mom is currently pumping and bottle feeding baby and then following with formula supplementation. Mom does not wish to latch baby at this point and says that the NS did not help with latch either. Mom says she is fuller today and pumped 25 cc this am, which she was pleased with. Parents wish to rent pump temporarily until Breast pump comes in from [employer] next week. Pump paperwork given to fill out and will return with pump prior to d/c. Mom is aware of outpatient services and BF Resources. Has LC phone #, told to call with questions or concerns.  Patient Name: [name] Today's Date: 10/08/2014     Maternal Data    Feeding    LATCH Score/Interventions                      Lactation Tools Discussed/Used     Consult Status      Ed Blalock 10/08/2014, 12:09 PM

## 2014-10-08 NOTE — Lactation Note (Signed)
This note was copied from the chart of [name]. Lactation Consultation Note  Pump rented to mom for use at home until insurance pump arrives. Care and cleaning of pump and pump parts discussed. Parents have LC # and know to call with questions and concerns prn.  Patient Name: [name] Today's Date: 10/08/2014     Maternal Data    Feeding    LATCH Score/Interventions                      Lactation Tools Discussed/Used     Consult Status      Ed Blalock 10/08/2014, 2:16 PM

## 2014-11-23 ENCOUNTER — Other Ambulatory Visit (HOSPITAL_COMMUNITY): Payer: Self-pay | Admitting: Family Medicine

## 2014-11-23 DIAGNOSIS — E041 Nontoxic single thyroid nodule: Secondary | ICD-10-CM

## 2014-11-30 ENCOUNTER — Ambulatory Visit (HOSPITAL_COMMUNITY)
Admission: RE | Admit: 2014-11-30 | Discharge: 2014-11-30 | Disposition: A | Discharge status: Home / Self Care | Payer: BC Managed Care – PPO | Source: Ambulatory Visit | Attending: Family Medicine | Admitting: Family Medicine

## 2014-11-30 DIAGNOSIS — E049 Nontoxic goiter, unspecified: Secondary | ICD-10-CM | POA: Insufficient documentation

## 2014-11-30 DIAGNOSIS — E041 Nontoxic single thyroid nodule: Secondary | ICD-10-CM | POA: Diagnosis present

## 2014-12-13 ENCOUNTER — Other Ambulatory Visit: Payer: Self-pay | Admitting: Family Medicine

## 2014-12-13 DIAGNOSIS — E041 Nontoxic single thyroid nodule: Secondary | ICD-10-CM

## 2014-12-14 ENCOUNTER — Other Ambulatory Visit: Payer: BC Managed Care – PPO

## 2014-12-16 ENCOUNTER — Ambulatory Visit
Admission: RE | Admit: 2014-12-16 | Discharge: 2014-12-16 | Disposition: A | Discharge status: Home / Self Care | Payer: BC Managed Care – PPO | Source: Ambulatory Visit | Attending: Family Medicine | Admitting: Family Medicine

## 2014-12-16 ENCOUNTER — Other Ambulatory Visit (HOSPITAL_COMMUNITY)
Admission: RE | Admit: 2014-12-16 | Discharge: 2014-12-16 | Disposition: A | Discharge status: Home / Self Care | Payer: BC Managed Care – PPO | Source: Ambulatory Visit | Attending: Interventional Radiology | Admitting: Interventional Radiology

## 2014-12-16 DIAGNOSIS — E041 Nontoxic single thyroid nodule: Secondary | ICD-10-CM

## 2014-12-16 DIAGNOSIS — E042 Nontoxic multinodular goiter: Secondary | ICD-10-CM | POA: Diagnosis not present

## 2016-03-14 LAB — OB RESULTS CONSOLE ABO/RH: RH Type: POSITIVE

## 2016-03-14 LAB — OB RESULTS CONSOLE ANTIBODY SCREEN: ANTIBODY SCREEN: NEGATIVE

## 2016-03-14 LAB — OB RESULTS CONSOLE HIV ANTIBODY (ROUTINE TESTING): HIV: NONREACTIVE

## 2016-03-14 LAB — OB RESULTS CONSOLE RPR: RPR: NONREACTIVE

## 2016-03-14 LAB — OB RESULTS CONSOLE RUBELLA ANTIBODY, IGM: RUBELLA: IMMUNE

## 2016-03-14 LAB — OB RESULTS CONSOLE HEPATITIS B SURFACE ANTIGEN: Hepatitis B Surface Ag: NEGATIVE

## 2016-03-14 LAB — OB RESULTS CONSOLE GC/CHLAMYDIA
CHLAMYDIA, DNA PROBE: NEGATIVE
GC PROBE AMP, GENITAL: NEGATIVE

## 2016-04-02 ENCOUNTER — Ambulatory Visit (INDEPENDENT_AMBULATORY_CARE_PROVIDER_SITE_OTHER): Payer: BC Managed Care – PPO | Admitting: Family Medicine

## 2016-04-02 ENCOUNTER — Encounter: Payer: Self-pay | Admitting: Family Medicine

## 2016-04-02 VITALS — BP 124/76 | HR 102 | Temp 98.4°F | Resp 18 | Wt 214.0 lb

## 2016-04-02 DIAGNOSIS — J069 Acute upper respiratory infection, unspecified: Secondary | ICD-10-CM | POA: Diagnosis not present

## 2016-04-02 NOTE — Progress Notes (Signed)
Subjective:    Patient ID: [name], female    DOB: [birth year], 31 y.o.   MRN: [id number]  HPI This is a 31 yo female who presents today with nasal congestion x 3 days. 3 month old son has had congestion. Feels stopped up and has thick yellow nasal drainage. Has been using a humidifier and a vapor rub (like Vick's). No fever, some post nasal drainage, cough with yellow sputum. Left ear feels congested. Had vertigo 3 days ago, resolved spontaneously. No SOB, no wheeze. Is [redacted] weeks pregnant and is not sure what she can take. Morning sickness improved.   Past Medical History:  Diagnosis Date  . Obesity    Past Surgical History:  Procedure Laterality Date  . BREAST LUMPECTOMY    . CESAREAN SECTION N/A 10/04/2014   Procedure: CESAREAN SECTION;  Surgeon: Jaymes Graff, MD;  Location: [facility];  Service: Obstetrics;  Laterality: N/A;   No family history on file. Social History  Substance Use Topics  . Smoking status: Never Smoker  . Smokeless tobacco: Not on file  . Alcohol use Yes     Comment: occas. when not preg      Review of Systems Per HPI    Objective:   Physical Exam  Constitutional: She is oriented to person, place, and time. She appears well-developed and well-nourished. No distress.  HENT:  Head: Normocephalic and atraumatic.  Right Ear: Tympanic membrane, external ear and ear canal normal.  Left Ear: Tympanic membrane, external ear and ear canal normal.  Nose: Mucosal edema and rhinorrhea present. Right sinus exhibits no maxillary sinus tenderness and no frontal sinus tenderness. Left sinus exhibits no maxillary sinus tenderness and no frontal sinus tenderness.  Mouth/Throat: Uvula is midline, oropharynx is clear and moist and mucous membranes are normal. No oropharyngeal exudate.  Eyes: Conjunctivae are normal.  Neck: Normal range of motion. Neck supple.  Cardiovascular: Normal rate, regular rhythm and normal heart sounds.   Pulmonary/Chest: Effort normal  and breath sounds normal.  Lymphadenopathy:    She has no cervical adenopathy.  Neurological: She is alert and oriented to person, place, and time.  Skin: Skin is warm and dry. She is not diaphoretic.  Psychiatric: She has a normal mood and affect. Her behavior is normal. Judgment and thought content normal.  Vitals reviewed.  BP 124/76 (BP Location: Right Arm, Patient Position: Sitting, Cuff Size: Normal)   Pulse (!) 102   Temp 98.4 F (36.9 C) (Oral)   Resp 18   Wt 214 lb (97.1 kg)   SpO2 99%   BMI 34.54 kg/m  Wt Readings from Last 3 Encounters:  04/02/16 214 lb (97.1 kg)  10/08/14 226 lb 12.8 oz (102.9 kg)      Assessment & Plan:  1. URI with cough and congestion - Provided written and verbal information regarding diagnosis and treatment.  Patient Instructions  You have a viral upper respiratory illness with cough and congestion For your symptoms, you can do the following- Tylenol for fever/pain Saline nasal spray 3-6 times a day to help with congestion Afrin nasal spray twice a day for 3 days maximum It is safe for you take the over the counter antihistamine cetirizine (Zyrtec)- this will help dry up your nasal drainage For cough you can use Delsym Be sure to get extra rest and drink enough fluids to make your urine light yellow Please call the office if you are not feeling better in 5-7 days or if you get worse.  Olean Reeeborah Dmitri Pettigrew, FNP-BC  [facility] [place], [facility]  04/04/2016 9:38 AM

## 2016-04-02 NOTE — Progress Notes (Signed)
Pre visit review using our clinic review tool, if applicable. No additional management support is needed unless otherwise documented below in the visit note. 

## 2016-04-02 NOTE — Patient Instructions (Signed)
You have a viral upper respiratory illness with cough and congestion For your symptoms, you can do the following- Tylenol for fever/pain Saline nasal spray 3-6 times a day to help with congestion Afrin nasal spray twice a day for 3 days maximum It is safe for you take the over the counter antihistamine cetirizine (Zyrtec)- this will help dry up your nasal drainage For cough you can use Delsym Be sure to get extra rest and drink enough fluids to make your urine light yellow Please call the office if you are not feeling better in 5-7 days or if you get worse.

## 2016-04-04 ENCOUNTER — Encounter: Payer: Self-pay | Admitting: Family Medicine

## 2016-04-12 ENCOUNTER — Ambulatory Visit (INDEPENDENT_AMBULATORY_CARE_PROVIDER_SITE_OTHER): Payer: BC Managed Care – PPO | Admitting: Primary Care

## 2016-04-12 ENCOUNTER — Encounter: Payer: Self-pay | Admitting: Primary Care

## 2016-04-12 DIAGNOSIS — J45990 Exercise induced bronchospasm: Secondary | ICD-10-CM

## 2016-04-12 DIAGNOSIS — K219 Gastro-esophageal reflux disease without esophagitis: Secondary | ICD-10-CM | POA: Insufficient documentation

## 2016-04-12 NOTE — Assessment & Plan Note (Signed)
No symptoms for years, participates in Zumba classes without difficulty.

## 2016-04-12 NOTE — Patient Instructions (Signed)
Start taking ranitidine tablets twice daily, everyday for acid reflux and abdominal symptoms.   Take a look at the information below regarding triggers for acid reflux.  Please call me if no improvement in 2 weeks, or sooner if symptoms get worse.  It was a pleasure to meet you today! Please don't hesitate to call me with any questions. Welcome to [facility]!   Food Choices for Gastroesophageal Reflux Disease, Adult When you have gastroesophageal reflux disease (GERD), the foods you eat and your eating habits are very important. Choosing the right foods can help ease the discomfort of GERD. Consider working with a diet and nutrition specialist (dietitian) to help you make healthy food choices. What general guidelines should I follow? Eating plan   Choose healthy foods low in fat, such as fruits, vegetables, whole grains, low-fat dairy products, and lean meat, fish, and poultry.  Eat frequent, small meals instead of three large meals each day. Eat your meals slowly, in a relaxed setting. Avoid bending over or lying down until 2-3 hours after eating.  Limit high-fat foods such as fatty meats or fried foods.  Limit your intake of oils, butter, and shortening to less than 8 teaspoons each day.  Avoid the following:  Foods that cause symptoms. These may be different for different people. Keep a food diary to keep track of foods that cause symptoms.  Alcohol.  Drinking large amounts of liquid with meals.  Eating meals during the 2-3 hours before bed.  Cook foods using methods other than frying. This may include baking, grilling, or broiling. Lifestyle    Maintain a healthy weight. Ask your health care provider what weight is healthy for you. If you need to lose weight, work with your health care provider to do so safely.  Exercise for at least 30 minutes on 5 or more days each week, or as told by your health care provider.  Avoid wearing clothes that fit tightly around your waist and  chest.  Do not use any products that contain nicotine or tobacco, such as cigarettes and e-cigarettes. If you need help quitting, ask your health care provider.  Sleep with the head of your bed raised. Use a wedge under the mattress or blocks under the bed frame to raise the head of the bed. What foods are not recommended? The items listed may not be a complete list. Talk with your dietitian about what dietary choices are best for you. Grains  Pastries or quick breads with added fat. [place] toast. Vegetables  Deep fried vegetables. [place] fries. Any vegetables prepared with added fat. Any vegetables that cause symptoms. For some people this may include tomatoes and tomato products, chili peppers, onions and garlic, and horseradish. Fruits  Any fruits prepared with added fat. Any fruits that cause symptoms. For some people this may include citrus fruits, such as oranges, grapefruit, pineapple, and lemons. Meats and other protein foods  High-fat meats, such as fatty beef or pork, hot dogs, ribs, ham, sausage, salami and bacon. Fried meat or protein, including fried fish and fried chicken. Nuts and nut butters. Dairy  Whole milk and chocolate milk. Sour cream. Cream. Ice cream. Cream cheese. Milk shakes. Beverages  Coffee and tea, with or without caffeine. Carbonated beverages. Sodas. Energy drinks. Fruit juice made with acidic fruits (such as orange or grapefruit). Tomato juice. Alcoholic drinks. Fats and oils  Butter. Margarine. Shortening. Ghee. Sweets and desserts  Chocolate and cocoa. Donuts. Seasoning and other foods  Pepper. Peppermint and spearmint. Any  condiments, herbs, or seasonings that cause symptoms. For some people, this may include curry, hot sauce, or vinegar-based salad dressings. Summary  When you have gastroesophageal reflux disease (GERD), food and lifestyle choices are very important to help ease the discomfort of GERD.  Eat frequent, small meals instead of three large  meals each day. Eat your meals slowly, in a relaxed setting. Avoid bending over or lying down until 2-3 hours after eating.  Limit high-fat foods such as fatty meat or fried foods. This information is not intended to replace advice given to you by your health care provider. Make sure you discuss any questions you have with your health care provider. Document Released: 01/15/2005 Document Revised: 01/17/2016 Document Reviewed: 01/17/2016 [employer] Interactive Patient Education  2017 [employer]

## 2016-04-12 NOTE — Progress Notes (Signed)
   Subjective:    Patient ID: [name], female    DOB: 03-22-1985, 31 y.o.   MRN: [id number]  HPI  [name] is a 31 year old female who presents today to establish care and discuss the problems mentioned below. Will obtain old records.  1) Abdominal Discomfort: Located to the epigastric region that occurs each time she eats. This will start about 15-20 minutes after eating and will last for several hours. She's kept track of what may be causing these symptoms and has stopped drinking orange juice.  She will sometimes have to force herself to vomit in order to feel better, this is not daily. Her morning sickness has improved which was a problem for several weeks. She does experience occasional esophageal burning, belching. She's taken Zantac as needed with improvement in esophageal burning, no improvement in abdominal pain. She denies diarrhea, fevers, daily nausea, bloody stools.   Review of Systems  Constitutional: Negative for chills and fever.  Respiratory: Negative for cough.   Cardiovascular: Negative for chest pain.  Gastrointestinal: Positive for abdominal pain and nausea. Negative for blood in stool, constipation, diarrhea and vomiting.  Neurological: Negative for weakness.       Past Medical History:  Diagnosis Date  . Exercise-induced asthma   . GERD (gastroesophageal reflux disease)   . Obesity      Social History   Social History  . Marital status: Married    Spouse name: N/A  . Number of children: N/A  . Years of education: N/A   Occupational History  . Not on file.   Social History Main Topics  . Smoking status: Never Smoker  . Smokeless tobacco: Never Used  . Alcohol use Yes     Comment: occas. when not preg  . Drug use: No  . Sexual activity: Not on file   Other Topics Concern  . Not on file   Social History Narrative   Married.   1 son, pregnant.   Works as a Customer service managerreal estate agent, teaches high school computers.   Enjoys watching TV, reading,  exercising.     Past Surgical History:  Procedure Laterality Date  . BREAST LUMPECTOMY    . CESAREAN SECTION N/A 10/04/2014   Procedure: CESAREAN SECTION;  Surgeon: Jaymes GraffNaima Dillard, MD;  Location: [facility];  Service: Obstetrics;  Laterality: N/A;    Family History  Problem Relation Age of Onset  . Hypertension Mother   . Breast cancer Paternal Aunt     No Known Allergies  Current Outpatient Prescriptions on File Prior to Visit  Medication Sig Dispense Refill  . Prenatal Vit-Fe Fumarate-FA (PRENATAL MULTIVITAMIN) TABS tablet Take 1 tablet by mouth daily at 12 noon.     No current facility-administered medications on file prior to visit.     BP 114/70   Pulse 80   Temp 98.2 F (36.8 C) (Oral)   Ht 5\' 6"  (1.676 m)   Wt 213 lb 1.9 oz (96.7 kg)   SpO2 96%   BMI 34.40 kg/m    Objective:   Physical Exam  Constitutional: She appears well-nourished. She does not appear ill.  Neck: Neck supple.  Cardiovascular: Normal rate and regular rhythm.   Pulmonary/Chest: Effort normal and breath sounds normal.  Abdominal: Soft. Bowel sounds are normal. There is no tenderness.  Skin: Skin is warm and dry.  Psychiatric: She has a normal mood and affect.          Assessment & Plan:

## 2016-04-12 NOTE — Progress Notes (Signed)
Pre visit review using our clinic review tool, if applicable. No additional management support is needed unless otherwise documented below in the visit note. 

## 2016-04-12 NOTE — Assessment & Plan Note (Signed)
Epigastric pain, belching, esophageal burning, nausea. Symptoms representative of GERD, especially given recent pregnancy. Abdominal exam unremarkable, she does not appear acutely ill. Will have her take Zantac 75-150 mg BID, daily for the next several weeks. Printed information regarding trigger foods for GERD. Encouraged exercise. Will call in two weeks for an update.

## 2016-04-18 ENCOUNTER — Telehealth: Payer: Self-pay | Admitting: Cardiology

## 2016-04-18 NOTE — Telephone Encounter (Signed)
Received [facility] for appointment on 04/19/16 with Dr Antoine PocheHochrein.  Records put with Dr Hochrein's schedule for 04/19/16. lp

## 2016-04-18 NOTE — Progress Notes (Signed)
Cardiology Office Note   Date:  04/20/2016   ID:  Taylor AltesVictoria Herms, [place] MeoDOB 03/25/1985, MRN [id number]  PCP:  Morrie Sheldonlark,Katherine Kendal, NP  Cardiologist:   Rollene RotundaJames Ramon Zanders, MD  Referring:  Dr. Osborn CohoAngela Roberts  Chief Complaint  Patient presents with  . Palpitations      History of Present Illness: [name] is a 31 y.o. female who presents for evaluation of palpitations. She is [redacted] weeks pregnant. She's been noticing palpitations that actually started before her pregnancy. She was in a stressful job situation. 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 However, she's continued to have some palpitations. She describes them as isolated skipped beats. She does not have any presyncope or syncope. She says they come on sporadically. She has not associated with mild caffeine use that she was using. They're still happening without caffeine. She otherwise takes care of her child and works 2 jobs. She denies any chest pressure, neck or arm discomfort. She has no new shortness of breath, PND or orthopnea. She's had some pregnancy weight gain and mild edema.  Past Medical History:  Diagnosis Date  . Exercise-induced asthma   . GERD (gastroesophageal reflux disease)   . Obesity     Past Surgical History:  Procedure Laterality Date  . BREAST LUMPECTOMY    . CESAREAN SECTION N/A 10/04/2014   Procedure: CESAREAN SECTION;  Surgeon: Jaymes GraffNaima Dillard, MD;  Location: [facility];  Service: Obstetrics;  Laterality: N/A;     Current Outpatient Prescriptions  Medication Sig Dispense Refill  . Cholecalciferol (HM VITAMIN D3) 4000 units CAPS Take 4,000 Units by mouth daily.    . NON FORMULARY Take 1 tablet by mouth 3 (three) times daily with meals. ENZEE    . Prenatal Vit-Fe Fumarate-FA (PRENATAL MULTIVITAMIN) TABS tablet Take 1 tablet by mouth daily at 12 noon.     No current facility-administered medications for this visit.      Allergies:   Patient has no known allergies.    Social History:  The patient  reports that she has never smoked. She has never used smokeless tobacco. She reports that she drinks alcohol. She reports that she does not use drugs.   Family History:  The patient's family history includes Anxiety disorder in her sister; Breast cancer in her paternal aunt; Endometriosis in her sister; Healthy in her father; Hypertension in her mother; Other in her sister; Post-traumatic stress disorder in her sister; Rheum arthritis in her sister.    ROS:  Please see the history of present illness.   Otherwise, review of systems are positive for none.   All other systems are reviewed and negative.    PHYSICAL EXAM: VS:  BP 113/72 (BP Location: Right Arm)   Pulse 90   Ht 5\' 6"  (1.676 m)   Wt 220 lb 3.2 oz (99.9 kg)   BMI 35.54 kg/m  , BMI Body mass index is 35.54 kg/m. GENERAL:  Well appearing HEENT:  Pupils equal round and reactive, fundi not visualized, oral mucosa unremarkable NECK:  No jugular venous distention, waveform within normal limits, carotid upstroke brisk and symmetric, no bruits, enlarged thyroid LYMPHATICS:  No cervical, inguinal adenopathy LUNGS:  Clear to auscultation bilaterally BACK:  No CVA tenderness CHEST:  Unremarkable HEART:  PMI not displaced or sustained,S1 and S2 within normal limits, no S3, no S4, no clicks, no rubs, soft 2 out of 6 nonradiating apical systolic murmur, no diastolic murmurs ABD:  Flat, positive bowel sounds normal in frequency in pitch, no bruits, no rebound, no guarding, no midline pulsatile mass, no hepatomegaly, no splenomegaly, gravid EXT:  2 plus pulses throughout, no edema, no cyanosis no clubbing SKIN:  No rashes no nodules NEURO:  Cranial nerves II through XII grossly intact, motor grossly intact throughout PSYCH:  Cognitively intact, oriented to person place and time    EKG:  EKG is ordered today. The ekg ordered today demonstrates sinus  rhythm, rate 90, axis within normal limits, intervals within normal limits, no acute ST-T wave changes.  She does have low voltage   Recent Labs: No results found for requested labs within last 8760 hours.    Lipid Panel No results found for: CHOL, TRIG, HDL, CHOLHDL, VLDL, LDLCALC, LDLDIRECT    Wt Readings from Last 3 Encounters:  04/19/16 220 lb 3.2 oz (99.9 kg)  04/12/16 213 lb 1.9 oz (96.7 kg)  04/02/16 214 lb (97.1 kg)      Other studies Reviewed: Additional studies/ records that were reviewed today include: Office records and labs. Review of the above records demonstrates:  Please see elsewhere in the note.     ASSESSMENT AND PLAN:  PALPITATIONS:  I found labs and she's had normal electrolytes and a recent TSH. She does have an abnormal EKG and will get an echocardiogram. However, she would prefer not to take any medications and I suspect PACs or PVCs. She will continue with the meds as listed.  ABNORMAL EKG:  She has low voltage without other EKGs to compare. I will evaluate with an echocardiogram.   Current medicines are reviewed at length with the patient today.  The patient does not have concerns regarding medicines.  The following changes have been made:  no change  Labs/ tests ordered today include:   Orders Placed This Encounter  Procedures  . EKG 12-Lead  . ECHOCARDIOGRAM COMPLETE     Disposition:   FU with me as needed    Signed, Rollene Rotunda, MD  04/20/2016 1:51 PM    [facility]

## 2016-04-19 ENCOUNTER — Encounter: Payer: Self-pay | Admitting: Cardiology

## 2016-04-19 ENCOUNTER — Ambulatory Visit (INDEPENDENT_AMBULATORY_CARE_PROVIDER_SITE_OTHER): Payer: BC Managed Care – PPO | Admitting: Cardiology

## 2016-04-19 VITALS — BP 113/72 | HR 90 | Ht 66.0 in | Wt 220.2 lb

## 2016-04-19 DIAGNOSIS — R002 Palpitations: Secondary | ICD-10-CM | POA: Diagnosis not present

## 2016-04-19 NOTE — Patient Instructions (Signed)
Medication Instructions:  Your physician recommends that you continue on your current medications as directed. Please refer to the Current Medication list given to you today.  Labwork: NONE  Testing/Procedures: Your physician has requested that you have an echocardiogram. Echocardiography is a painless test that uses sound waves to create images of your heart. It provides your doctor with information about the size and shape of your heart and how well your heart's chambers and valves are working. This procedure takes approximately one hour. There are no restrictions for this procedure. [facility].  Follow-Up: Your physician recommends that you schedule a follow-up appointment in: AS NEEDED with Dr. Antoine PocheHochrein.   Any Other Special Instructions Will Be Listed Below (If Applicable).     If you need a refill on your cardiac medications before your next appointment, please call your pharmacy.

## 2016-04-20 ENCOUNTER — Encounter: Payer: Self-pay | Admitting: Cardiology

## 2016-04-26 ENCOUNTER — Telehealth: Payer: Self-pay | Admitting: Primary Care

## 2016-04-26 NOTE — Telephone Encounter (Signed)
-----   Message from Doreene NestKatherine K Bayley Hurn, NP sent at 04/12/2016  9:05 AM EDT ----- Regarding: GERD Please check on patient's abdominal pain since we told her to take Zantac twice daily everyday. Any better? Worse?

## 2016-04-26 NOTE — Telephone Encounter (Signed)
Message left for patient to return my call.  

## 2016-05-02 NOTE — Telephone Encounter (Signed)
Noted and glad to hear! 

## 2016-05-02 NOTE — Telephone Encounter (Signed)
Spoken to patient she stated that the stomach pain is better. She is still taking Zantac as needed not twice a day. She is doing good on that.

## 2016-05-07 ENCOUNTER — Ambulatory Visit (HOSPITAL_COMMUNITY): Payer: BC Managed Care – PPO | Attending: Cardiovascular Disease

## 2016-05-07 ENCOUNTER — Other Ambulatory Visit: Payer: Self-pay

## 2016-05-07 DIAGNOSIS — R002 Palpitations: Secondary | ICD-10-CM | POA: Diagnosis not present

## 2016-05-07 DIAGNOSIS — O9989 Other specified diseases and conditions complicating pregnancy, childbirth and the puerperium: Secondary | ICD-10-CM | POA: Diagnosis not present

## 2016-05-07 DIAGNOSIS — Z3A15 15 weeks gestation of pregnancy: Secondary | ICD-10-CM | POA: Diagnosis present

## 2016-05-07 LAB — ECHOCARDIOGRAM COMPLETE
CHL CUP DOP CALC LVOT VTI: 25.6 cm
CHL CUP MV DEC (S): 239
E/e' ratio: 5.45
EWDT: 239 ms
FS: 45 % — AB (ref 28–44)
IV/PV OW: 1.05
LA ID, A-P, ES: 34 mm
LA vol A4C: 20.9 ml
LA vol index: 15.3 mL/m2
LA vol: 31.8 mL
LADIAMINDEX: 1.63 cm/m2
LEFT ATRIUM END SYS DIAM: 34 mm
LV PW d: 8.39 mm — AB (ref 0.6–1.1)
LV e' LATERAL: 13.7 cm/s
LVEEAVG: 5.45
LVEEMED: 5.45
LVOT area: 2.54 cm2
LVOT peak grad rest: 6 mmHg
LVOTD: 18 mm
LVOTPV: 118 cm/s
LVOTSV: 65 mL
MV Peak grad: 2 mmHg
MV pk A vel: 59.7 m/s
MVPKEVEL: 74.7 m/s
RV LATERAL S' VELOCITY: 16.3 cm/s
TAPSE: 28.1 mm
TDI e' lateral: 13.7
TDI e' medial: 7.07

## 2016-06-12 DIAGNOSIS — E669 Obesity, unspecified: Secondary | ICD-10-CM | POA: Insufficient documentation

## 2016-06-12 DIAGNOSIS — Z8759 Personal history of other complications of pregnancy, childbirth and the puerperium: Secondary | ICD-10-CM | POA: Insufficient documentation

## 2016-06-12 DIAGNOSIS — Z98891 History of uterine scar from previous surgery: Secondary | ICD-10-CM | POA: Insufficient documentation

## 2016-06-12 HISTORY — DX: Personal history of other complications of pregnancy, childbirth and the puerperium: Z87.59

## 2016-08-08 DIAGNOSIS — O24419 Gestational diabetes mellitus in pregnancy, unspecified control: Secondary | ICD-10-CM

## 2016-08-08 HISTORY — DX: Gestational diabetes mellitus in pregnancy, unspecified control: O24.419

## 2016-08-15 ENCOUNTER — Encounter: Payer: Self-pay | Admitting: Registered"

## 2016-08-15 ENCOUNTER — Encounter: Payer: BC Managed Care – PPO | Attending: Obstetrics and Gynecology | Admitting: Registered"

## 2016-08-15 DIAGNOSIS — R7309 Other abnormal glucose: Secondary | ICD-10-CM

## 2016-08-15 DIAGNOSIS — O99810 Abnormal glucose complicating pregnancy: Secondary | ICD-10-CM | POA: Insufficient documentation

## 2016-08-15 DIAGNOSIS — Z3A00 Weeks of gestation of pregnancy not specified: Secondary | ICD-10-CM | POA: Insufficient documentation

## 2016-08-15 NOTE — Progress Notes (Signed)
Patient was seen on 08/15/2016 for Gestational Diabetes self-management class at the [facility]. The following learning objectives were met by the patient during this course:   States the definition of Gestational Diabetes  States why dietary management is important in controlling blood glucose  Describes the effects each nutrient has on blood glucose levels  Demonstrates ability to create a balanced meal plan  Demonstrates carbohydrate counting   States when to check blood glucose levels  Demonstrates proper blood glucose monitoring techniques  States the effect of stress and exercise on blood glucose levels  States the importance of limiting caffeine and abstaining from alcohol and smoking  Blood glucose monitor given: Cont[employer] Lot # [id number] Exp: 10/28/16 Blood glucose reading: 87  Patient instructed to monitor glucose levels: FBS: 60 - <95 1 hour: <140 2 hour: <120  Patient received handouts:  Nutrition Diabetes and Pregnancy  Carbohydrate Counting List  Patient will be seen for follow-up as needed.

## 2016-09-24 DIAGNOSIS — Z22330 Carrier of Group B streptococcus: Secondary | ICD-10-CM | POA: Insufficient documentation

## 2016-09-24 HISTORY — DX: Carrier of group B Streptococcus: Z22.330

## 2016-09-27 ENCOUNTER — Encounter (HOSPITAL_COMMUNITY): Payer: Self-pay | Admitting: *Deleted

## 2016-09-27 ENCOUNTER — Telehealth (HOSPITAL_COMMUNITY): Payer: Self-pay | Admitting: *Deleted

## 2016-09-27 NOTE — Telephone Encounter (Signed)
Preadmission screen  

## 2016-10-05 ENCOUNTER — Other Ambulatory Visit: Payer: Self-pay | Admitting: Obstetrics and Gynecology

## 2016-10-06 ENCOUNTER — Encounter (HOSPITAL_COMMUNITY): Payer: Self-pay | Admitting: *Deleted

## 2016-10-06 ENCOUNTER — Inpatient Hospital Stay (HOSPITAL_COMMUNITY)
Admission: AD | Admit: 2016-10-06 | Discharge: 2016-10-10 | DRG: 765 | Disposition: A | Discharge status: Home / Self Care | Payer: BC Managed Care – PPO | Source: Ambulatory Visit | Attending: Obstetrics and Gynecology | Admitting: Obstetrics and Gynecology

## 2016-10-06 ENCOUNTER — Inpatient Hospital Stay (HOSPITAL_COMMUNITY): Payer: BC Managed Care – PPO | Admitting: Anesthesiology

## 2016-10-06 ENCOUNTER — Inpatient Hospital Stay (HOSPITAL_COMMUNITY): Admission: RE | Admit: 2016-10-06 | Payer: BC Managed Care – PPO | Source: Ambulatory Visit

## 2016-10-06 DIAGNOSIS — J45909 Unspecified asthma, uncomplicated: Secondary | ICD-10-CM | POA: Diagnosis present

## 2016-10-06 DIAGNOSIS — E669 Obesity, unspecified: Secondary | ICD-10-CM | POA: Diagnosis present

## 2016-10-06 DIAGNOSIS — Z3A39 39 weeks gestation of pregnancy: Secondary | ICD-10-CM

## 2016-10-06 DIAGNOSIS — O9081 Anemia of the puerperium: Secondary | ICD-10-CM | POA: Diagnosis not present

## 2016-10-06 DIAGNOSIS — O34211 Maternal care for low transverse scar from previous cesarean delivery: Secondary | ICD-10-CM | POA: Diagnosis present

## 2016-10-06 DIAGNOSIS — Z98891 History of uterine scar from previous surgery: Secondary | ICD-10-CM

## 2016-10-06 DIAGNOSIS — O9952 Diseases of the respiratory system complicating childbirth: Secondary | ICD-10-CM | POA: Diagnosis present

## 2016-10-06 DIAGNOSIS — O99214 Obesity complicating childbirth: Secondary | ICD-10-CM | POA: Diagnosis present

## 2016-10-06 DIAGNOSIS — D62 Acute posthemorrhagic anemia: Secondary | ICD-10-CM | POA: Diagnosis not present

## 2016-10-06 DIAGNOSIS — O99824 Streptococcus B carrier state complicating childbirth: Secondary | ICD-10-CM | POA: Diagnosis present

## 2016-10-06 DIAGNOSIS — Z6837 Body mass index (BMI) 37.0-37.9, adult: Secondary | ICD-10-CM | POA: Diagnosis not present

## 2016-10-06 DIAGNOSIS — O24425 Gestational diabetes mellitus in childbirth, controlled by oral hypoglycemic drugs: Secondary | ICD-10-CM | POA: Diagnosis present

## 2016-10-06 DIAGNOSIS — Z8709 Personal history of other diseases of the respiratory system: Secondary | ICD-10-CM | POA: Insufficient documentation

## 2016-10-06 HISTORY — DX: Personal history of other diseases of the respiratory system: Z87.09

## 2016-10-06 LAB — COMPREHENSIVE METABOLIC PANEL
ALBUMIN: 3.1 g/dL — AB (ref 3.5–5.0)
ALK PHOS: 119 U/L (ref 38–126)
ALT: 13 U/L — AB (ref 14–54)
AST: 23 U/L (ref 15–41)
Anion gap: 9 (ref 5–15)
BILIRUBIN TOTAL: 0.8 mg/dL (ref 0.3–1.2)
BUN: 7 mg/dL (ref 6–20)
CO2: 24 mmol/L (ref 22–32)
Calcium: 9.1 mg/dL (ref 8.9–10.3)
Chloride: 102 mmol/L (ref 101–111)
Creatinine, Ser: 0.62 mg/dL (ref 0.44–1.00)
GFR calc Af Amer: >60 mL/min (ref >60)
GLUCOSE: 105 mg/dL — AB (ref 65–99)
POTASSIUM: 4 mmol/L (ref 3.5–5.1)
Sodium: 135 mmol/L (ref 135–145)
Total Protein: 6.5 g/dL (ref 6.5–8.1)

## 2016-10-06 LAB — PROTEIN / CREATININE RATIO, URINE
CREATININE, URINE: 67 mg/dL
Protein Creatinine Ratio: 0.16 mg/mg{Cre} — ABNORMAL HIGH (ref 0.00–0.15)
TOTAL PROTEIN, URINE: 11 mg/dL

## 2016-10-06 LAB — CBC
HCT: 32.5 % — ABNORMAL LOW (ref 36.0–46.0)
Hemoglobin: 11.1 g/dL — ABNORMAL LOW (ref 12.0–15.0)
MCH: 29.1 pg (ref 26.0–34.0)
MCHC: 34.2 g/dL (ref 30.0–36.0)
MCV: 85.1 fL (ref 78.0–100.0)
PLATELETS: 224 10*3/uL (ref 150–400)
RBC: 3.82 MIL/uL — AB (ref 3.87–5.11)
RDW: 14.1 % (ref 11.5–15.5)
WBC: 7.2 10*3/uL (ref 4.0–10.5)

## 2016-10-06 LAB — RPR: RPR: NONREACTIVE

## 2016-10-06 LAB — GLUCOSE, CAPILLARY
GLUCOSE-CAPILLARY: 75 mg/dL (ref 65–99)
Glucose-Capillary: 101 mg/dL — ABNORMAL HIGH (ref 65–99)

## 2016-10-06 LAB — OB RESULTS CONSOLE GBS: GBS: POSITIVE

## 2016-10-06 LAB — TYPE AND SCREEN
ABO/RH(D): A POS
Antibody Screen: NEGATIVE

## 2016-10-06 LAB — LACTATE DEHYDROGENASE: LDH: 176 U/L (ref 98–192)

## 2016-10-06 LAB — URIC ACID: URIC ACID, SERUM: 4.3 mg/dL (ref 2.3–6.6)

## 2016-10-06 LAB — POCT FERN TEST

## 2016-10-06 MED ORDER — OXYTOCIN BOLUS FROM INFUSION: 500.0000 mL | Freq: Once | INTRAVENOUS | Status: DC

## 2016-10-06 MED ORDER — OXYTOCIN 40 UNITS IN LACTATED RINGERS INFUSION - SIMPLE MED
1.0000 m[IU]/min | INTRAVENOUS | Status: DC
  Administered 2016-10-06: 2 m[IU]/min via INTRAVENOUS

## 2016-10-06 MED ORDER — EPHEDRINE 5 MG/ML INJ
10.0000 mg | INTRAVENOUS | Status: DC | PRN
  Filled 2016-10-06: qty 2

## 2016-10-06 MED ORDER — OXYTOCIN 40 UNITS IN LACTATED RINGERS INFUSION - SIMPLE MED
2.5000 [IU]/h | INTRAVENOUS | Status: DC
  Filled 2016-10-06: qty 1000

## 2016-10-06 MED ORDER — FENTANYL CITRATE (PF) 100 MCG/2ML IJ SOLN: 50.0000 ug | INTRAMUSCULAR | Status: DC | PRN

## 2016-10-06 MED ORDER — TERBUTALINE SULFATE 1 MG/ML IJ SOLN
0.2500 mg | Freq: Once | INTRAMUSCULAR | Status: AC | PRN
  Administered 2016-10-07: 0.25 mg via SUBCUTANEOUS
  Filled 2016-10-06: qty 1

## 2016-10-06 MED ORDER — LIDOCAINE HCL (PF) 1 % IJ SOLN
30.0000 mL | INTRAMUSCULAR | Status: DC | PRN
  Filled 2016-10-06: qty 30

## 2016-10-06 MED ORDER — DIPHENHYDRAMINE HCL 50 MG/ML IJ SOLN: 12.5000 mg | INTRAMUSCULAR | Status: DC | PRN

## 2016-10-06 MED ORDER — LACTATED RINGERS IV SOLN: 500.0000 mL | INTRAVENOUS | Status: DC | PRN

## 2016-10-06 MED ORDER — LACTATED RINGERS IV SOLN
INTRAVENOUS | Status: DC
  Administered 2016-10-06 – 2016-10-07 (×4): via INTRAVENOUS

## 2016-10-06 MED ORDER — PHENYLEPHRINE 40 MCG/ML (10ML) SYRINGE FOR IV PUSH (FOR BLOOD PRESSURE SUPPORT)
80.0000 ug | PREFILLED_SYRINGE | INTRAVENOUS | Status: DC | PRN
  Filled 2016-10-06: qty 10
  Filled 2016-10-06: qty 5

## 2016-10-06 MED ORDER — LACTATED RINGERS IV SOLN: 500.0000 mL | Freq: Once | INTRAVENOUS | Status: DC

## 2016-10-06 MED ORDER — FENTANYL 2.5 MCG/ML BUPIVACAINE 1/10 % EPIDURAL INFUSION (WH - ANES)
14.0000 mL/h | INTRAMUSCULAR | Status: DC | PRN
  Administered 2016-10-06 (×2): 14 mL/h via EPIDURAL
  Filled 2016-10-06 (×2): qty 100

## 2016-10-06 MED ORDER — ACETAMINOPHEN 325 MG PO TABS: 650.0000 mg | ORAL_TABLET | ORAL | Status: DC | PRN

## 2016-10-06 MED ORDER — PENICILLIN G POT IN DEXTROSE 60000 UNIT/ML IV SOLN
3.0000 10*6.[IU] | INTRAVENOUS | Status: DC
  Administered 2016-10-06 – 2016-10-07 (×4): 3 10*6.[IU] via INTRAVENOUS
  Filled 2016-10-06 (×7): qty 50

## 2016-10-06 MED ORDER — PHENYLEPHRINE 40 MCG/ML (10ML) SYRINGE FOR IV PUSH (FOR BLOOD PRESSURE SUPPORT)
80.0000 ug | PREFILLED_SYRINGE | INTRAVENOUS | Status: DC | PRN
  Filled 2016-10-06: qty 5

## 2016-10-06 MED ORDER — SOD CITRATE-CITRIC ACID 500-334 MG/5ML PO SOLN
30.0000 mL | ORAL | Status: DC | PRN
  Administered 2016-10-07: 30 mL via ORAL
  Filled 2016-10-06: qty 15

## 2016-10-06 MED ORDER — PENICILLIN G POTASSIUM 5000000 UNITS IJ SOLR
5.0000 10*6.[IU] | Freq: Once | INTRAMUSCULAR | Status: AC
  Administered 2016-10-06: 5 10*6.[IU] via INTRAVENOUS
  Filled 2016-10-06: qty 5

## 2016-10-06 MED ORDER — SODIUM BICARBONATE 8.4 % IV SOLN
INTRAVENOUS | Status: DC | PRN
  Administered 2016-10-06: 3 mL via EPIDURAL
  Administered 2016-10-06: 4 mL via EPIDURAL

## 2016-10-06 MED ORDER — ONDANSETRON HCL 4 MG/2ML IJ SOLN: 4.0000 mg | Freq: Four times a day (QID) | INTRAMUSCULAR | Status: DC | PRN

## 2016-10-06 NOTE — Progress Notes (Signed)
LABOR PROGRESS NOTE  Taylor AltesVictoria Suen is a 31 y.o. G2P1001 at 5691w1d  admitted for SROM @ 500am clear fluid. Pt was also scheduled for induction today. History of ceserean section. GBS Positive  Subjective: Feeling contractions; mild  Objective: BP 139/83   Pulse 84   Temp 98.2 F (36.8 C) (Oral)   Resp 18   Ht 5\' 6"  (1.676 m)   Wt 234 lb (106.1 kg)   LMP 01/06/2016   BMI 37.77 kg/m  or  Vitals:   10/06/16 0605 10/06/16 0630 10/06/16 0654 10/06/16 0936  BP: 140/87 135/80 139/83   Pulse: 86 84 84   Resp: 18   18  Temp: 97.9 F (36.6 C)   98.2 F (36.8 C)  TempSrc:    Oral  Weight: 234 lb (106.1 kg)     Height: 5\' 6"  (1.676 m)        Dilation: 1 Effacement (%): Thick Cervical Position: Posterior Station: Ballotable Presentation: Vertex Exam by:: Ferne CoeS Earl RNC  Labs: Lab Results  Component Value Date   WBC 7.2 10/06/2016   HGB 11.1 (L) 10/06/2016   HCT 32.5 (L) 10/06/2016   MCV 85.1 10/06/2016   PLT 224 10/06/2016    Patient Active Problem List   Diagnosis Date Noted  . Indication for care or intervention in labor or delivery 10/06/2016  . GERD (gastroesophageal reflux disease) 04/12/2016  . Mild exercise-induced asthma 10/04/2014    Assessment / Plan: 31 y.o.  Labor: Early Fetal Wellbeing:  Category 1 GBS Positive- Pcn  Anticipated MOD:  SVD  Lori A Clemmons 10/06/2016, 9:45 AM

## 2016-10-06 NOTE — Anesthesia Pain Management Evaluation Note (Signed)
  [facility] Pain Management Visit Note  Patient: [name], 31 y.o., female  "Hello I am a member of the anesthesia team at [facility]. We have an anesthesia team available at all times to provide care throughout the hospital, including epidural management and anesthesia for C-section. I don't know your plan for the delivery whether it a natural birth, water birth, IV sedation, nitrous supplementation, doula or epidural, but we want to meet your pain goals."   1.Was your pain managed to your expectations on prior hospitalizations?   Yes   2.What is your expectation for pain management during this hospitalization?     Epidural and Nitrous Oxide  3.How can we help you reach that goal? Nitrous oxide then epidural then necessary  Record the patient's initial score and the patient's pain goal.   Pain: 5  Pain Goal: 8 [facility] wants you to be able to say your pain was always managed very well.  [name] 10/06/2016

## 2016-10-06 NOTE — Progress Notes (Signed)
Toco adj

## 2016-10-06 NOTE — MAU Note (Signed)
Ctxs since 0345. LEaking fld around 0500 and clear. For IOL at 0730 due to gest diabetes. Hx preeclampsia first pregnancy and had c/s due to fetal distress. Desires TOLAC

## 2016-10-06 NOTE — H&P (Signed)
[name] is a 31 y.o. female, G2P1001 at 39.1 weeks, presenting for SROM at 0500.  Patient was scheduled for IOL s/t GDM-A2 on Glyburide.  Patient under the care of [facility] and pregnancy history significant for H/O C/S, H/O PreEclampsia, Asthma, and Obesity.   Patient Active Problem List   Diagnosis Date Noted  . GERD (gastroesophageal reflux disease) 04/12/2016  . Mild exercise-induced asthma 10/04/2014    History of present pregnancy: Patient entered care at 9.5 weeks.   EDC of 10/12/2016 was established by 01/06/2016.   Anatomy scan:  20.3 weeks, with normal findings and an anterior placenta.   Additional US evaluations:  BPP Weekly d/t GDM-A2  Last evaluation:  10/03/2016  At 38.5wks 242lbs  OB History    Gravida Para Term Preterm AB Living   2 1 1     1    SAB TAB Ectopic Multiple Live Births         0 1     Past Medical History:  Diagnosis Date  . Depression   . Exercise-induced asthma   . GERD (gastroesophageal reflux disease)   . Gestational diabetes    glyburide  . Hx of pre-eclampsia in prior pregnancy, currently pregnant   . Obesity   . Palpitations    normal cardiologist visit   Past Surgical History:  Procedure Laterality Date  . BREAST LUMPECTOMY    . CESAREAN SECTION N/A 10/04/2014   Procedure: CESAREAN SECTION;  Surgeon: Jaymes GraffNaima Dillard, MD;  Location: [facility];  Service: Obstetrics;  Laterality: N/A;   Family History: family history includes Anxiety disorder in her sister; Breast cancer in her paternal aunt; Diabetes in her paternal aunt; Endometriosis in her sister; Healthy in her father; Hypertension in her mother; Miscarriages / Stillbirths in her sister; Other in her sister; Post-traumatic stress disorder in her sister; Rheum arthritis in her sister. Social History:  reports that she has never smoked. She has never used smokeless tobacco. She reports that she drinks alcohol. She reports that she does not use drugs.   Prenatal Transfer Tool  Maternal  Diabetes: No Genetic Screening: Normal Maternal Ultrasounds/Referrals: Normal Fetal Ultrasounds or other Referrals:  None Maternal Substance Abuse:  No Significant Maternal Medications:  Meds include: Other: Glyburide  Significant Maternal Lab Results: Lab values include: Group B Strep positive    Allergies  Allergen Reactions  . Banana     Severe abdominal pain       Blood pressure 139/83, pulse 84, temperature 97.9 F (36.6 C), resp. rate 18, height 5\' 6"  (1.676 m), weight 106.1 kg (234 lb), last menstrual period 01/06/2016, unknown if currently breastfeeding.  Physical Exam  Vitals reviewed. Constitutional: She is oriented to person, place, and time. She appears well-developed and well-nourished.  HENT:  Head: Normocephalic and atraumatic.  Eyes: Conjunctivae are normal.  Neck: Normal range of motion.  Cardiovascular: Normal rate.   Respiratory: Effort normal.  GI: Soft.  Musculoskeletal: Normal range of motion.  Neurological: She is alert and oriented to person, place, and time.  Skin: Skin is warm and dry.  Psychiatric: She has a normal mood and affect. Her behavior is normal.    FHR: 135 bpm, Mod Var, -Decels, +Accels UCs:  Irregular  Prenatal labs: ABO, Rh: A/Positive/-- (02/14 0000) Antibody: Negative (02/14 0000) Rubella: Immune RPR: Nonreactive (02/14 0000)  HBsAg: Negative (02/14 0000)  HIV: Non-reactive (02/14 0000)  GBS:  Positive Sickle cell/Hgb electrophoresis:  Normal Pap:  Unknown GC:  Negative Chlamydia:  Negative Other:  None  Assessment IUP at 39.1wks Cat I FT GDM-A2 SROM TOLAC Asthma   Plan: Admit to [employer] Routine Labor and Delivery Orders per [facility] Protocol PCN for GBS prophylaxis  Expectant Management Dr.EV to be updated as appropriate L.Clemmons, CNM given report   Joellyn Quails, MSN 10/06/2016, 7:00 AM

## 2016-10-06 NOTE — Progress Notes (Signed)
LABOR PROGRESS NOTE  [name] is a 31 y.o. G2P1001 at 5373w1d  admitted for SROM  Subjective: Doing well. Tolerating contractions Objective: BP 139/83   Pulse 84   Temp 98.2 F (36.8 C) (Oral)   Resp 18   Ht 5\' 6"  (1.676 m)   Wt 234 lb (106.1 kg)   LMP 01/06/2016   BMI 37.77 kg/m  or  Vitals:   10/06/16 0605 10/06/16 0630 10/06/16 0654 10/06/16 0936  BP: 140/87 135/80 139/83   Pulse: 86 84 84   Resp: 18   18  Temp: 97.9 F (36.6 C)   98.2 F (36.8 C)  TempSrc:    Oral  Weight: 234 lb (106.1 kg)     Height: 5\' 6"  (1.676 m)        Dilation: 2 Effacement (%): 50 Cervical Position: Posterior Station: -2 Presentation: Vertex Exam by:: m wilkins rnc FHR- baseline 135 Uc's q 6  Labs: Lab Results  Component Value Date   WBC 7.2 10/06/2016   HGB 11.1 (L) 10/06/2016   HCT 32.5 (L) 10/06/2016   MCV 85.1 10/06/2016   PLT 224 10/06/2016    Patient Active Problem List   Diagnosis Date Noted  . Indication for care or intervention in labor or delivery 10/06/2016  . GERD (gastroesophageal reflux disease) 04/12/2016  . Mild exercise-induced asthma 10/04/2014    Assessment / Plan: 31 y.o.  Labor: Early Fetal Wellbeing:  Cat 1 Pain Control:   PCN for GBS Pitocin on 4 mu/min Anticipated MOD:  SVD  Lori A Clemmons CNM 10/06/2016, 2:22 PM

## 2016-10-06 NOTE — Anesthesia Preprocedure Evaluation (Signed)
Anesthesia Evaluation  Patient identified by MRN, date of birth, ID band Patient awake    Airway Mallampati: II  TM Distance: >3 FB     Dental no notable dental hx.    Pulmonary neg shortness of breath, asthma , neg recent URI,    Pulmonary exam normal        Cardiovascular negative cardio ROS Normal cardiovascular exam     Neuro/Psych    GI/Hepatic Neg liver ROS, GERD  Medicated,  Endo/Other  diabetes, Well Controlled, Type 2, Oral Hypoglycemic Agents  Renal/GU negative Renal ROS     Musculoskeletal negative musculoskeletal ROS (+)   Abdominal (+) + obese,   Peds negative pediatric ROS (+)  Hematology negative hematology ROS (+)   Anesthesia Other Findings   Reproductive/Obstetrics (+) Pregnancy                             Anesthesia Physical Anesthesia Plan  ASA: II  Anesthesia Plan: Epidural   Post-op Pain Management:    Induction:   PONV Risk Score and Plan:   Airway Management Planned:   Additional Equipment:   Intra-op Plan:   Post-operative Plan:   Informed Consent: I have reviewed the patients History and Physical, chart, labs and discussed the procedure including the risks, benefits and alternatives for the proposed anesthesia with the patient or authorized representative who has indicated his/her understanding and acceptance.     Plan Discussed with:   Anesthesia Plan Comments:         Anesthesia Quick Evaluation

## 2016-10-06 NOTE — Progress Notes (Signed)
J Emly CNM notified of pt's admission and status. Aware of srom, elevated b/p with initial reading and normal for repeat, FM strip reassuring but not reactive. 0730 IOL who is TOLAC. CNM Will put in admit orders if not in already

## 2016-10-06 NOTE — Progress Notes (Signed)
[name] MRN: [id number]  Subjective: -Care assumed of 31 y.o. G2P1001 at 7141w1d who presents for SROM. In room to meet acquaintance of patient and family.  Patient comfortable with epidural and reports some mild shakes, but denies rectal pressure. Patient further denies  HA, visual disturbances, epigastric pain, and SOB.   Objective: BP 140/89   Pulse 63   Temp 97.9 F (36.6 C)   Resp 18   Ht 5\' 6"  (1.676 m)   Wt 106.1 kg (234 lb)   LMP 01/06/2016   BMI 37.77 kg/m  No intake/output data recorded. No intake/output data recorded.  Fetal Monitoring: FHT: 130 bpm, Mod Var, -Decels, +Accels UC: Q3-744min, palpates moderate to strong    Physical Exam: General appearance: alert, well appearing, and in no distress. Chest: normal rate and regular rhythm.  clear to auscultation, no wheezes, rales or rhonchi, symmetric air entry. Abdominal exam: Soft, NT, Gravid, Appears AGA. Extremities: +2 Edema of BLE, No clonus Skin exam: Warm Dry  Vaginal Exam: SVE:   Dilation: 5 Effacement (%): 90 Station: -3 Exam by:: m wilkins rnc Membranes: Forebag Internal Monitors: None  Augmentation/Induction: Pitocin:7110mUn/min Cytotec: None  Assessment:  IUP at 39.1wks Cat I FT  TOLAC Labor Augmentation Elevated BP  H/O PreEclampsia  Plan: -PIH Labs Ordered -Discussed when to contact nurse for constant rectal pressure or urge to push -Continue other mgmt as ordered -Dr. Enid BaasEV updated on patient status   Taylor CavaJessica L Shakeeta Godette,MSN, CNM 10/06/2016, 7:43 PM

## 2016-10-06 NOTE — Anesthesia Procedure Notes (Signed)
Epidural Patient location during procedure: OB  Staffing Anesthesiologist: Odette FractionHARKINS, Kimbly Eanes Performed: anesthesiologist   Preanesthetic Checklist Completed: patient identified, site marked, surgical consent, pre-op evaluation, timeout performed, IV checked, risks and benefits discussed and monitors and equipment checked  Epidural Patient position: sitting Prep: DuraPrep Patient monitoring: heart rate, continuous pulse ox and blood pressure Injection technique: LOR saline and LOR air  Needle:  Needle type: Tuohy  Needle gauge: 18 G Needle length: 9 cm and 9 Needle insertion depth: 9 cm Catheter type: closed end flexible Catheter size: 20 Guage Catheter at skin depth: 16 cm Test dose: negative  Assessment Events: blood not aspirated, injection not painful, no injection resistance, negative IV test and no paresthesia  Additional Notes Patient identified. Risks/Benefits/Options discussed with patient including but not limited to bleeding, infection, nerve damage, paralysis, failed block, incomplete pain control, headache, blood pressure changes, nausea, vomiting, reactions to medication both or allergic, itching and postpartum back pain. Confirmed with bedside nurse the patient's most recent platelet count. Confirmed with patient that they are not currently taking any anticoagulation, have any bleeding history or any family history of bleeding disorders. Patient expressed understanding and wished to proceed. All questions were answered. Sterile technique was used throughout the entire procedure. Please see nursing notes for vital signs. Test dose was given through epidural needle and negative prior to continuing to dose epidural or start infusion.   Catheter was initially adjusted to 4 cm in the epidural space, and laid on her side prior to dressing placement. Catheter depth at skin 16 cm after lying down.   Patient was given instructions on fall risk and not to get out of bed. All  questions and concerns addressed with instructions to call with any issues.

## 2016-10-06 NOTE — Progress Notes (Signed)
[name] MRN: [id number]  Subjective: -Nurse call reports patient with c/o gush of fluid but bulging bag noted on exam.  In room to assess. Patient with c/o rectal pressure with contractions.   Objective: BP 120/74   Pulse 72   Temp 97.7 F (36.5 C) (Oral)   Resp 18   Ht 5\' 6"  (1.676 m)   Wt 106.1 kg (234 lb)   LMP 01/06/2016   BMI 37.77 kg/m  No intake/output data recorded. No intake/output data recorded.  Fetal Monitoring: FHT: 135 bpm, Mod Var, -Decels, +Accels UC: Q3-284min, palpates moderate to strong    Vaginal Exam: SVE:   Dilation: 6 Effacement (%): 90 Station: -3 Exam by:: [name], CNM Membranes:Forebag artificially ruptured Internal Monitors: IUPC inserted  Augmentation/Induction: Pitocin:610mUn/min Cytotec: None  Assessment:  IUP at 39.1wks Cat I FT  Labor Augmentation Amniotomy  Plan: -Discussed AROM r/b including increased risk of infection, cord prolapse, fetal intolerance, and decreased labor time. No questions or concerns and patient desires to proceed with AROM-forebag.  -Some fetal distress noted with prolonged decelerations after AROM.  Provider remained at bedside until subsided.  No interventions necessary besides repositioning into semi-fowlers.   -Continue other mgmt as ordered -Anticipating SVD   Taylor CavaJessica L Cherye Gaertner,MSN, CNM 10/06/2016, 9:58 PM

## 2016-10-06 NOTE — Progress Notes (Signed)
[name] is a 31 y.o. G2P1001 at 39w1 Subjective: Comfortable with Epidural. Pitocin infusing at 4 mu/min  Objective: BP (!) 146/124   Pulse 70   Temp 97.9 F (36.6 C)   Resp 18   Ht 5\' 6"  (1.676 m)   Wt 234 lb (106.1 kg)   LMP 01/06/2016   BMI 37.77 kg/m  No intake/output data recorded. No intake/output data recorded.  FHT:  130 Cat 1 UC:   q 3-5  SVE:   Dilation: 4.5 Effacement (%): 90 Station: Ballotable Exam by:: m wilkins rnc  Labs: Lab Results  Component Value Date   WBC 7.2 10/06/2016   HGB 11.1 (L) 10/06/2016   HCT 32.5 (L) 10/06/2016   MCV 85.1 10/06/2016   PLT 224 10/06/2016    Assessment / Plan: IUP@ 39+1 Augmentation of labor SROM for 13 hours-Afebrile GDM-2 Glyburide- BG stable TOLAC FHT's 130 baseline Category 1  Pain Control:  Epidural I/D:  GBS Positive Anticipated MOD: SVD  Laquana Villari A Hser Belanger CNM 10/06/2016, 6:45 PM

## 2016-10-07 ENCOUNTER — Encounter (HOSPITAL_COMMUNITY): Payer: Self-pay | Admitting: *Deleted

## 2016-10-07 ENCOUNTER — Encounter (HOSPITAL_COMMUNITY)
Admission: AD | Disposition: A | Discharge status: Home / Self Care | Payer: Self-pay | Source: Ambulatory Visit | Attending: Obstetrics and Gynecology

## 2016-10-07 DIAGNOSIS — Z98891 History of uterine scar from previous surgery: Secondary | ICD-10-CM

## 2016-10-07 LAB — CBC
HCT: 26.6 % — ABNORMAL LOW (ref 36.0–46.0)
HEMATOCRIT: 30 % — AB (ref 36.0–46.0)
Hemoglobin: 10.1 g/dL — ABNORMAL LOW (ref 12.0–15.0)
Hemoglobin: 9 g/dL — ABNORMAL LOW (ref 12.0–15.0)
MCH: 28.6 pg (ref 26.0–34.0)
MCH: 28.4 pg (ref 26.0–34.0)
MCHC: 33.7 g/dL (ref 30.0–36.0)
MCHC: 33.8 g/dL (ref 30.0–36.0)
MCV: 85 fL (ref 78.0–100.0)
MCV: 83.9 fL (ref 78.0–100.0)
PLATELETS: 208 10*3/uL (ref 150–400)
Platelets: 196 10*3/uL (ref 150–400)
RBC: 3.53 MIL/uL — AB (ref 3.87–5.11)
RBC: 3.17 MIL/uL — ABNORMAL LOW (ref 3.87–5.11)
RDW: 14.1 % (ref 11.5–15.5)
RDW: 14 % (ref 11.5–15.5)
WBC: 15.5 10*3/uL — AB (ref 4.0–10.5)
WBC: 16.1 10*3/uL — ABNORMAL HIGH (ref 4.0–10.5)

## 2016-10-07 LAB — GLUCOSE, CAPILLARY
GLUCOSE-CAPILLARY: 135 mg/dL — AB (ref 65–99)
GLUCOSE-CAPILLARY: 128 mg/dL — AB (ref 65–99)
Glucose-Capillary: 86 mg/dL (ref 65–99)
Glucose-Capillary: 151 mg/dL — ABNORMAL HIGH (ref 65–99)
Glucose-Capillary: 188 mg/dL — ABNORMAL HIGH (ref 65–99)

## 2016-10-07 SURGERY — Surgical Case
Anesthesia: Epidural

## 2016-10-07 MED ORDER — MEPERIDINE HCL 25 MG/ML IJ SOLN
INTRAMUSCULAR | Status: AC
  Filled 2016-10-07: qty 1

## 2016-10-07 MED ORDER — IBUPROFEN 600 MG PO TABS
600.0000 mg | ORAL_TABLET | Freq: Four times a day (QID) | ORAL | Status: DC
  Administered 2016-10-07 – 2016-10-10 (×13): 600 mg via ORAL
  Filled 2016-10-07 (×13): qty 1

## 2016-10-07 MED ORDER — SODIUM CHLORIDE 0.9 % IR SOLN
Status: DC | PRN
  Administered 2016-10-07: 500 mL

## 2016-10-07 MED ORDER — DEXAMETHASONE SODIUM PHOSPHATE 10 MG/ML IJ SOLN
INTRAMUSCULAR | Status: DC | PRN
  Administered 2016-10-07: 10 mg via INTRAVENOUS

## 2016-10-07 MED ORDER — HYDROMORPHONE HCL 1 MG/ML IJ SOLN: 0.2500 mg | INTRAMUSCULAR | Status: DC | PRN

## 2016-10-07 MED ORDER — LACTATED RINGERS IV SOLN
INTRAVENOUS | Status: DC | PRN
  Administered 2016-10-07: 02:00:00 via INTRAVENOUS

## 2016-10-07 MED ORDER — ONDANSETRON HCL 4 MG/2ML IJ SOLN
INTRAMUSCULAR | Status: DC | PRN
  Administered 2016-10-07: 4 mg via INTRAVENOUS

## 2016-10-07 MED ORDER — METOCLOPRAMIDE HCL 5 MG/ML IJ SOLN: 10.0000 mg | Freq: Once | INTRAMUSCULAR | Status: DC | PRN

## 2016-10-07 MED ORDER — SIMETHICONE 80 MG PO CHEW
80.0000 mg | CHEWABLE_TABLET | ORAL | Status: DC | PRN
  Filled 2016-10-07: qty 1

## 2016-10-07 MED ORDER — MEPERIDINE HCL 25 MG/ML IJ SOLN
INTRAMUSCULAR | Status: DC | PRN
  Administered 2016-10-07 (×2): 12.5 mg via INTRAVENOUS

## 2016-10-07 MED ORDER — CEFAZOLIN SODIUM-DEXTROSE 2-3 GM-% IV SOLR
INTRAVENOUS | Status: DC | PRN
  Administered 2016-10-07: 2 g via INTRAVENOUS

## 2016-10-07 MED ORDER — OXYCODONE-ACETAMINOPHEN 5-325 MG PO TABS
1.0000 | ORAL_TABLET | ORAL | Status: DC | PRN
  Administered 2016-10-07 – 2016-10-08 (×3): 1 via ORAL
  Filled 2016-10-07 (×3): qty 1

## 2016-10-07 MED ORDER — OXYTOCIN 40 UNITS IN LACTATED RINGERS INFUSION - SIMPLE MED
2.5000 [IU]/h | INTRAVENOUS | Status: AC
  Administered 2016-10-07: 2.5 [IU]/h via INTRAVENOUS
  Filled 2016-10-07: qty 1000

## 2016-10-07 MED ORDER — FENTANYL CITRATE (PF) 100 MCG/2ML IJ SOLN
INTRAMUSCULAR | Status: AC
  Filled 2016-10-07: qty 2

## 2016-10-07 MED ORDER — OXYCODONE-ACETAMINOPHEN 5-325 MG PO TABS: 2.0000 | ORAL_TABLET | ORAL | Status: DC | PRN

## 2016-10-07 MED ORDER — ACETAMINOPHEN 325 MG PO TABS: 650.0000 mg | ORAL_TABLET | ORAL | Status: DC | PRN

## 2016-10-07 MED ORDER — MEPERIDINE HCL 25 MG/ML IJ SOLN: 6.2500 mg | INTRAMUSCULAR | Status: DC | PRN

## 2016-10-07 MED ORDER — SIMETHICONE 80 MG PO CHEW
80.0000 mg | CHEWABLE_TABLET | Freq: Three times a day (TID) | ORAL | Status: DC
  Administered 2016-10-07 – 2016-10-09 (×9): 80 mg via ORAL
  Filled 2016-10-07 (×10): qty 1

## 2016-10-07 MED ORDER — TETANUS-DIPHTH-ACELL PERTUSSIS 5-2.5-18.5 LF-MCG/0.5 IM SUSP: 0.5000 mL | Freq: Once | INTRAMUSCULAR | Status: DC

## 2016-10-07 MED ORDER — LIDOCAINE-EPINEPHRINE 2 %-1:100000 IJ SOLN
INTRAMUSCULAR | Status: DC | PRN
  Administered 2016-10-07: 5 mL via INTRADERMAL
  Administered 2016-10-07: 10 mL via INTRADERMAL

## 2016-10-07 MED ORDER — OXYTOCIN 10 UNIT/ML IJ SOLN
INTRAVENOUS | Status: DC | PRN
  Administered 2016-10-07: 40 [IU] via INTRAVENOUS

## 2016-10-07 MED ORDER — METHYLERGONOVINE MALEATE 0.2 MG PO TABS: 0.2000 mg | ORAL_TABLET | ORAL | Status: DC | PRN

## 2016-10-07 MED ORDER — METHYLERGONOVINE MALEATE 0.2 MG/ML IJ SOLN: 0.2000 mg | INTRAMUSCULAR | Status: DC | PRN

## 2016-10-07 MED ORDER — SIMETHICONE 80 MG PO CHEW
80.0000 mg | CHEWABLE_TABLET | ORAL | Status: DC
  Administered 2016-10-08 – 2016-10-09 (×3): 80 mg via ORAL
  Filled 2016-10-07 (×3): qty 1

## 2016-10-07 MED ORDER — KETOROLAC TROMETHAMINE 60 MG/2ML IM SOLN
30.0000 mg | Freq: Once | INTRAMUSCULAR | Status: AC
  Administered 2016-10-07: 30 mg via INTRAMUSCULAR
  Filled 2016-10-07: qty 2

## 2016-10-07 MED ORDER — LACTATED RINGERS IV SOLN
INTRAVENOUS | Status: DC
  Administered 2016-10-07: 01:00:00 via INTRAUTERINE

## 2016-10-07 MED ORDER — MENTHOL 3 MG MT LOZG: 1.0000 | LOZENGE | OROMUCOSAL | Status: DC | PRN

## 2016-10-07 MED ORDER — ZOLPIDEM TARTRATE 5 MG PO TABS: 5.0000 mg | ORAL_TABLET | Freq: Every evening | ORAL | Status: DC | PRN

## 2016-10-07 MED ORDER — COCONUT OIL OIL: 1.0000 "application " | TOPICAL_OIL | Status: DC | PRN

## 2016-10-07 MED ORDER — FENTANYL CITRATE (PF) 100 MCG/2ML IJ SOLN
INTRAMUSCULAR | Status: DC | PRN
  Administered 2016-10-07: 100 ug via INTRAVENOUS

## 2016-10-07 MED ORDER — WITCH HAZEL-GLYCERIN EX PADS: 1.0000 "application " | MEDICATED_PAD | CUTANEOUS | Status: DC | PRN

## 2016-10-07 MED ORDER — SENNOSIDES-DOCUSATE SODIUM 8.6-50 MG PO TABS
2.0000 | ORAL_TABLET | ORAL | Status: DC
  Administered 2016-10-08 – 2016-10-09 (×3): 2 via ORAL
  Filled 2016-10-07 (×3): qty 2

## 2016-10-07 MED ORDER — LACTATED RINGERS IV SOLN
INTRAVENOUS | Status: DC
  Administered 2016-10-07 (×3): via INTRAVENOUS

## 2016-10-07 MED ORDER — PRENATAL MULTIVITAMIN CH
1.0000 | ORAL_TABLET | Freq: Every day | ORAL | Status: DC
  Administered 2016-10-07 – 2016-10-10 (×4): 1 via ORAL
  Filled 2016-10-07 (×4): qty 1

## 2016-10-07 MED ORDER — KETOROLAC TROMETHAMINE 30 MG/ML IJ SOLN
INTRAMUSCULAR | Status: AC
  Filled 2016-10-07: qty 1

## 2016-10-07 MED ORDER — DIBUCAINE 1 % RE OINT: 1.0000 "application " | TOPICAL_OINTMENT | RECTAL | Status: DC | PRN

## 2016-10-07 MED ORDER — DIPHENHYDRAMINE HCL 25 MG PO CAPS: 25.0000 mg | ORAL_CAPSULE | Freq: Four times a day (QID) | ORAL | Status: DC | PRN

## 2016-10-07 MED ORDER — FERROUS SULFATE 325 (65 FE) MG PO TABS
325.0000 mg | ORAL_TABLET | Freq: Two times a day (BID) | ORAL | Status: DC
  Administered 2016-10-07 – 2016-10-10 (×7): 325 mg via ORAL
  Filled 2016-10-07 (×7): qty 1

## 2016-10-07 SURGICAL SUPPLY — 40 items
BARRIER ADHS 3X4 INTERCEED (GAUZE/BANDAGES/DRESSINGS) ×4 IMPLANT
BENZOIN TINCTURE PRP APPL 2/3 (GAUZE/BANDAGES/DRESSINGS) ×2 IMPLANT
CHLORAPREP W/TINT 26ML (MISCELLANEOUS) ×2 IMPLANT
CLAMP CORD UMBIL (MISCELLANEOUS) IMPLANT
CLOTH BEACON ORANGE TIMEOUT ST (SAFETY) ×2 IMPLANT
CLSR STERI-STRIP ANTIMIC 1/2X4 (GAUZE/BANDAGES/DRESSINGS) ×2 IMPLANT
DERMABOND ADVANCED (GAUZE/BANDAGES/DRESSINGS)
DERMABOND ADVANCED .7 DNX12 (GAUZE/BANDAGES/DRESSINGS) IMPLANT
DRSG OPSITE POSTOP 4X10 (GAUZE/BANDAGES/DRESSINGS) ×2 IMPLANT
ELECT REM PT RETURN 9FT ADLT (ELECTROSURGICAL) ×2
ELECTRODE REM PT RTRN 9FT ADLT (ELECTROSURGICAL) ×1 IMPLANT
EXTRACTOR VACUUM BELL STYLE (SUCTIONS) IMPLANT
GAUZE SPONGE 4X4 16PLY XRAY LF (GAUZE/BANDAGES/DRESSINGS) ×2 IMPLANT
GLOVE BIO SURGEON STRL SZ7 (GLOVE) ×6 IMPLANT
GLOVE BIOGEL PI IND STRL 7.0 (GLOVE) ×3 IMPLANT
GLOVE BIOGEL PI INDICATOR 7.0 (GLOVE) ×3
GOWN STRL REUS W/TWL LRG LVL3 (GOWN DISPOSABLE) ×4 IMPLANT
KIT ABG SYR 3ML LUER SLIP (SYRINGE) IMPLANT
NEEDLE HYPO 25X5/8 SAFETYGLIDE (NEEDLE) IMPLANT
NS IRRIG 1000ML POUR BTL (IV SOLUTION) ×2 IMPLANT
PACK C SECTION WH (CUSTOM PROCEDURE TRAY) ×2 IMPLANT
PAD ABD 7.5X8 STRL (GAUZE/BANDAGES/DRESSINGS) ×2 IMPLANT
PAD OB MATERNITY 4.3X12.25 (PERSONAL CARE ITEMS) ×2 IMPLANT
PENCIL SMOKE EVAC W/HOLSTER (ELECTROSURGICAL) ×2 IMPLANT
RTRCTR C-SECT PINK 25CM LRG (MISCELLANEOUS) ×2 IMPLANT
SPONGE GAUZE 4X4 12PLY STER LF (GAUZE/BANDAGES/DRESSINGS) ×2 IMPLANT
SPONGE LAP 18X18 X RAY DECT (DISPOSABLE) ×2 IMPLANT
SPONGE SURGIFOAM ABS GEL 12-7 (HEMOSTASIS) ×2 IMPLANT
STRIP CLOSURE SKIN 1/2X4 (GAUZE/BANDAGES/DRESSINGS) IMPLANT
SUT CHROMIC 0 CTX 36 (SUTURE) IMPLANT
SUT MON AB 4-0 PS1 27 (SUTURE) ×2 IMPLANT
SUT PLAIN 0 NONE (SUTURE) IMPLANT
SUT PLAIN 2 0 XLH (SUTURE) ×2 IMPLANT
SUT VIC AB 0 CT1 36 (SUTURE) ×2 IMPLANT
SUT VIC AB 0 CTX 36 (SUTURE) ×5
SUT VIC AB 0 CTX36XBRD ANBCTRL (SUTURE) ×5 IMPLANT
SUT VIC AB 2-0 CT1 27 (SUTURE) ×1
SUT VIC AB 2-0 CT1 TAPERPNT 27 (SUTURE) ×1 IMPLANT
TOWEL OR 17X24 6PK STRL BLUE (TOWEL DISPOSABLE) ×2 IMPLANT
TRAY FOLEY BAG SILVER LF 14FR (SET/KITS/TRAYS/PACK) IMPLANT

## 2016-10-07 NOTE — Anesthesia Postprocedure Evaluation (Signed)
Anesthesia Post Note  Patient: [name]  Procedure(s) Performed: Procedure(s) (LRB): CESAREAN SECTION (N/A)     Patient location during evaluation: Mother Baby Anesthesia Type: Epidural Level of consciousness: awake, awake and alert and oriented Pain management: pain level controlled Vital Signs Assessment: post-procedure vital signs reviewed and stable Respiratory status: spontaneous breathing, nonlabored ventilation and respiratory function stable Cardiovascular status: blood pressure returned to baseline and stable Postop Assessment: no headache, no backache, no signs of nausea or vomiting and adequate PO intake Anesthetic complications: no    Last Vitals:  Vitals:   10/07/16 0702 10/07/16 0812  BP: 138/75 (P) 137/76  Pulse: 88 (P) 85  Resp: 18 (P) 16  Temp: 36.8 C (P) 37.1 C  SpO2: 95% (P) 96%    Last Pain:  Vitals:   10/07/16 0813  TempSrc:   PainSc: 3    Pain Goal: Patients Stated Pain Goal: 0 (10/07/16 0813)               Adelfa Kohichard W Sheral FlowFlowers Jr

## 2016-10-07 NOTE — Transfer of Care (Signed)
Immediate Anesthesia Transfer of Care Note  Patient: [name]  Procedure(s) Performed: Procedure(s): CESAREAN SECTION (N/A)  Patient Location: PACU  Anesthesia Type:Epidural  Level of Consciousness: awake  Airway & Oxygen Therapy: Patient Spontanous Breathing  Post-op Assessment: Report given to RN and Post -op Vital signs reviewed and stable  Post vital signs: Reviewed and stable  Last Vitals:  Vitals:   10/07/16 0132 10/07/16 0348  BP: (!) 134/91 116/71  Pulse: 98 (!) 107  Resp:  14  Temp:    SpO2:  99%    Last Pain:  Vitals:   10/07/16 0143  TempSrc:   PainSc: 8       Patients Stated Pain Goal: 0 (10/06/16 [id number])  Complications: No apparent anesthesia complications

## 2016-10-07 NOTE — Consult Note (Signed)
[facility]  Delivery Note:  C-section       10/07/2016  2:26 AM  I was called to the operating room at the request of the patient's obstetrician (Dr. Varnado) for a repeat c-section.  PRENATAL HX:  This is a 30 y/oG2P1001 at 39 and 2/[redacted] weeks gestation who was admitted yesterday for SROM at 0500 (ROM ~22.5 hours).  She is GBS positive and received adequate treatment. Her pregnancy has been complicated by GDM for which she is on Glyburide.  She has had a previous c-section but did a trial of labor.  However, CODE C-section was called for fetal bradycardia.  Fetal HR was in 80s when the c-section was called, and then 150s right before the incision.    DELIVERY:  Infant cried at delivery with good heart rate, requiring no resuscitation other than standard warming, drying and stimulation.  She had mild hypotonia that improved by 10 minutes of age.  APGARs 7, 8 and 9.  Exam notable for caput but was otherwise within normal limits.  After 10 minutes, baby left with nurse to assist parents with skin-to-skin care.   _____________________ Electronically Signed By: Brandice Busser, MD Neonatologist  

## 2016-10-07 NOTE — Progress Notes (Signed)
Subjective: Postop Day 0: Cesarean Delivery No complaints.  Pain controlled.  Lochia normal.  Breast feeding yes.  No flatus.  C/o feeling gassy. Pt went to NICU due to hypoglycemia.  Once there, she had a seizure.  Per pt, concerns for ICH.  Objective: Temp:  [97.7 F (36.5 C)-99.3 F (37.4 C)] 98.2 F (36.8 C) (09/09 1714) Pulse Rate:  [71-173] 86 (09/09 1714) Resp:  [13-18] 18 (09/09 1714) BP: (113-145)/(71-91) 134/71 (09/09 1714) SpO2:  [89 %-100 %] 96 % (09/09 1714)  Physical Exam: Gen: NAD Uterine Fundus: firm, appropriately tender Abdomen distended.  Tympanic to percussion.  Appropriately tender. Incision: Pressure dressing clean. DVT Evaluation: + Edema present, no calf tenderness bilaterally    Recent Labs  10/07/16 0538 10/07/16 0947  HGB 10.1* 9.0*  HCT 30.0* 26.6*   CBG 151 @ 0420 CBG 188 @ 0852.  Pt had apple juice prior to this reading.  Assessment/Plan: Status post emergent C-section with an extensive extension-doing well postoperatively. GDM A2-  Elevated "fasting" CBG but not a true fasting.  Random CBG now and in 2 hours which will be 6 hours fasting.  Encouraged ambulation.  Mylicon. I discussed intraop findings with pt and husband.All questions answered. [facility] to resume care at 7 am.      Geryl RankinsVARNADO, Naziya Hegwood 10/07/2016, 7:45 PM

## 2016-10-07 NOTE — Anesthesia Postprocedure Evaluation (Signed)
Anesthesia Post Note  Patient: Shelda AltesVictoria Hockett  Procedure(s) Performed: Procedure(s) (LRB): CESAREAN SECTION (N/A)     Patient location during evaluation: PACU Anesthesia Type: Epidural Level of consciousness: awake and alert and oriented Pain management: satisfactory to patient Vital Signs Assessment: post-procedure vital signs reviewed and stable Respiratory status: spontaneous breathing, nonlabored ventilation and respiratory function stable Cardiovascular status: blood pressure returned to baseline and stable Postop Assessment: no headache, no backache, epidural receding, patient able to bend at knees and no signs of nausea or vomiting Anesthetic complications: no    Last Vitals:  Vitals:   10/07/16 0430 10/07/16 0445  BP: 127/84 113/90  Pulse: (!) 102 95  Resp: 17 14  Temp:    SpO2: 100% 100%    Last Pain:  Vitals:   10/07/16 0430  TempSrc:   PainSc: 0-No pain   Pain Goal: Patients Stated Pain Goal: 0 (10/06/16 [id number])               Gwynne EdingerPaul C Vic Esco

## 2016-10-07 NOTE — Brief Op Note (Signed)
10/06/2016 - 10/07/2016  4:50 AM  PATIENT:  [name]  31 y.o. female  PRE-OPERATIVE DIAGNOSIS: IUP @ 39 2/7 weeks,  Repeat cesearean section, fetal distress, H/o previous cesarean section, GDM A2  POST-OPERATIVE DIAGNOSIS:  Same  PROCEDURE:  Procedure(s): CESAREAN SECTION (N/A), Repeat, 2 layer closure. Repair of cervical laceration  SURGEON:  Surgeon(s) and Role:    Geryl Rankins* Khristina Janota, MD - Primary  PHYSICIAN ASSISTANT:   ASSISTANTS: Gerrit HeckJessica Emly, CNM   ANESTHESIA:   epidural  EBL:  Total I/O In: 1600 [I.V.:1600] Out: 1700 [Urine:600; Blood:1100]  BLOOD ADMINISTERED:none  DRAINS: Urinary Catheter (Foley)   LOCAL MEDICATIONS USED:  NONE  SPECIMEN:  Source of Specimen:  Placenta  DISPOSITION OF SPECIMEN:  PATHOLOGY  COUNTS:  YES  TOURNIQUET:  * No tourniquets in log *  DICTATION: .Other Dictation: Dictation Number [id number]  PLAN OF CARE: Admit to inpatient   PATIENT DISPOSITION:  PACU - hemodynamically stable.   Delay start of Pharmacological VTE agent (>24hrs) due to surgical blood loss or risk of bleeding: yes

## 2016-10-07 NOTE — Addendum Note (Signed)
Addendum  created 10/07/16 [id number] by Okey RegalFlowers, Kateleen Encarnacion W Jr., CRNA   Sign clinical note

## 2016-10-07 NOTE — Progress Notes (Signed)
Taylor AltesVictoria Gonzales MRN: [id number]  Subjective: -Nurse call reports prolonged decelerations despite interventions-pitocin discontinued.  In room to assess.  Patient resting in bed. Reports some rectal pressure with contractions, but not as strong.   Objective: BP 133/87   Pulse 95   Temp 97.7 F (36.5 C) (Oral)   Resp 18   Ht 5\' 6"  (1.676 m)   Wt 106.1 kg (234 lb)   LMP 01/06/2016   SpO2 100%   BMI 37.77 kg/m  No intake/output data recorded. Total I/O In: -  Out: 450 [Urine:450]  Fetal Monitoring: FHT: 155 bpm, Mod Var, +Decels, +Accels UC: Q1-3, MVUs 100mmHg    Vaginal Exam: SVE:   Dilation: Lip/rim Effacement (%): 100 Station: 0, +1 Exam by:: Taylor BurtonEmily Rothermel RN  Membranes:Ruptured Internal Monitors: IUPC  Augmentation/Induction: Pitocin:Discontinued Cytotec: None  Assessment:  IUP at 39.2wks Cat II FT  IOL Transition Phase  Plan: -Dr. Carroll SageE. Varnado updated on patient status -Will start amnioinfusion 250/125 -Allow for rest and will restart pitocin once FHR shows improvement -Discussed possibility of vacuum usage if variables continue during pushing phase -Q/C addressed -Continue other mgmt as ordered   Taylor CavaJessica L Karysa Heft,MSN, CNM 10/07/2016, 12:43 AM

## 2016-10-07 NOTE — Progress Notes (Signed)
S: Nurse call reports patient with abdominal pain.  In room to assess. Patient reports pain is constant at top of abdomen and similar to "gas pain."  O: 150 bpm, Mod Var, +Decels, +Accels Ctx Q2-454min, palpates strong MVUs 180s  A: IUP at 39.2wks Cat II FT TOLAC Abdominal Pain  P: -Recommendation for repeat c/s in light of new symptoms and fetal intolerance.  -R/B reviewed including, but not limited to, infection, bleeding, pain, damage to organs or fetus resulting in need for additional surgery.  Patient understands and accepts these risks and wishes to proceed with c/s. -Dr. Enid BaasEV updated on patient status and en route to hospital with ETA of 8 minutes. -Fetus in 760s and terbutaline given -Fetus in 30s and code c/s called -Upon exit of room FHR in 80s and in 150s upon arrival to OR -Dr. Marcie BalJ. Arnold updated on patient case -Dr. Fontaine NoE.V in OR prior to c/s start  Cherre RobinsJessica L Kalmen Lollar MSN, CNM  10/07/2016 2:09 AM

## 2016-10-07 NOTE — Lactation Note (Signed)
This note was copied from a baby's chart. Lactation Consultation Note  Patient Name: [name] Today's Date: 10/07/2016 Reason for consult: Initial assessment Baby at 9 hr of life. Upon entry NT was helping mom syringe feed fromula. Mom stated to lactation she no longer desires to latch, she would like to pump to feed. Explained she may not get enough expressed milk to meet baby's needs and will need to offer formula until her milk transitions. Reviewed the risks of formula. Parents were agreeable. Demonstrated paced bottle feeding with a slow flow nipple. Switched baby to [employer]. Set up DEBP, demonstrated manual expression, and reviewed expressed milk handling. Parents were given verbal and written formula handing instructions. Discussed baby behavior, feeding frequency, baby belly size, voids, wt loss, breast changes, and nipple care. Given lactation handouts. Aware of OP services and support group. Parents will call as needed.      Maternal Data Has patient been taught Hand Expression?: Yes  Feeding Feeding Type: Bottle Fed - Formula Nipple Type: Slow - flow Length of feed:  (few sucks)  LATCH Score Latch: Repeated attempts needed to sustain latch, nipple held in mouth throughout feeding, stimulation needed to elicit sucking reflex.  Audible Swallowing: None  Type of Nipple: Flat  Comfort (Breast/Nipple): Soft / non-tender  Hold (Positioning): Assistance needed to correctly position infant at breast and maintain latch.  LATCH Score: 5  Interventions    Lactation Tools Discussed/Used Pump Review: Setup, frequency, and cleaning;Milk Storage;Other (comment) (pump settings) Initiated by:: ES Date initiated:: 10/07/16   Consult Status Consult Status: PRN    Rulon Eisenmengerlizabeth E Alexsandro Salek 10/07/2016, 11:52 AM

## 2016-10-08 LAB — CBC
HCT: 21.5 % — ABNORMAL LOW (ref 36.0–46.0)
Hemoglobin: 7.3 g/dL — ABNORMAL LOW (ref 12.0–15.0)
MCH: 28.6 pg (ref 26.0–34.0)
MCHC: 34 g/dL (ref 30.0–36.0)
MCV: 84.3 fL (ref 78.0–100.0)
PLATELETS: 171 10*3/uL (ref 150–400)
RBC: 2.55 MIL/uL — AB (ref 3.87–5.11)
RDW: 14.4 % (ref 11.5–15.5)
WBC: 10 10*3/uL (ref 4.0–10.5)

## 2016-10-08 LAB — GLUCOSE, CAPILLARY: Glucose-Capillary: 130 mg/dL — ABNORMAL HIGH (ref 65–99)

## 2016-10-08 MED ORDER — GLYBURIDE 2.5 MG PO TABS
2.5000 mg | ORAL_TABLET | Freq: Every day | ORAL | Status: DC
  Administered 2016-10-08: 2.5 mg via ORAL
  Filled 2016-10-08 (×2): qty 1

## 2016-10-08 MED ORDER — GLYBURIDE 5 MG PO TABS
5.0000 mg | ORAL_TABLET | ORAL | Status: DC
  Administered 2016-10-08: 5 mg via ORAL
  Filled 2016-10-08: qty 1

## 2016-10-08 NOTE — Lactation Note (Signed)
This note was copied from a baby's chart. Lactation Consultation Note  Patient Name: [name] ZOXWR'UToday's Date: 10/08/2016 Reason for consult: Follow-up assessment   consult with this mom of a NICU baby, sent to NICU with low blood sugars, and now with seizures, possible bleed. I asked mom how pumping was going. She said with all the issues with the baby, she has not been able to pump every 3 hours. I told mom that was understandable, and for her to do her best. I reviewed hand  Expression with mom, and told her to add this after pumping. Mom has a persoanalDEP at home. Mom knows to call for questions/conerns.    Maternal Data    Feeding Feeding Type: Formula Nipple Type: Slow - flow Length of feed: 20 min  LATCH Score                   Interventions    Lactation Tools Discussed/Used     Consult Status Consult Status: PRN Follow-up type: In-patient (NICU)    Alfred LevinsLee, [name] 10/08/2016, 5:04 PM

## 2016-10-08 NOTE — Op Note (Signed)
NAMShelda Gonzales:  Gonzales, [name]              ACCOUNT NO.:  [id number]  MEDICAL RECORD NO.:  [id number]  LOCATION:  9170                          FACILITY:  [facility]  PHYSICIAN:  Pieter PartridgeEvelyn B Doll Frazee, MD   DATE OF BIRTH:  24-Dec-1985  DATE OF PROCEDURE:  10/07/2016 DATE OF DISCHARGE:                              OPERATIVE REPORT   PREOPERATIVE DIAGNOSES: 1. Intrauterine pregnancy at 39-2/7th weeks. 2. Repeat Caesarian section. 3. Fetal distress. 4. History of previous cesarean section. 5. Gestational diabetes mellitus A2. 6. Obesity.  POSTOPERATIVE DIAGNOSES: 1. Intrauterine pregnancy at 39-2/7th weeks. 2. Repeat Caesarian section. 3. Fetal distress. 4. History of previous cesarean section. 5. Gestational diabetes mellitus A2. 6. Obesity.  PROCEDURES:  Repeat low transverse cesarean section with 2-layer closure and repair of cervical laceration.  SURGEON:  Pieter PartridgeEvelyn B Jensen Kilburg, MD  ASSISTANT:  Clyde LundborgJessica Emily, certified nurse midwife.  ANESTHESIA:  Epidural.  ESTIMATED BLOOD LOSS:  1100.  DRAINS:  Foley catheter.  SPECIMEN:  Placenta.  DISPOSITION OF SPECIMEN:  To Pathology.  PATIENT DISPOSITION:  To PACU, hemodynamically stable.  COMPLICATIONS:  None.  FINDINGS:  Intact lower uterine segment.  Fetus low in the pelvis. Apgars were 7, 8 and 9.  Cord pH 6.987, that specimen was clotted, however.  Questionable hematoma on the left portion of the uterus. Normal tubes and ovaries.  Nuchal cord x2.  INDICATIONS:  [name] presented for induction of labor due to gestational diabetes.  She had a history of a previous C-section, counseled on risk of VBAC, and desired to proceed with a trial of labor. Labor progressed.  The patient got to almost complete.  Variable decelerations were noted that became prolonged and deep.  The patient started to complain of abdominal pain, so then she was rushed to the C- section suite for a repeat emergency C-section.  The patient was prepped with Betadine,  draped.  Epidural anesthesia had been optimized.  When the C-section was called, fetal heart tones were in the 60s.  Prior to the incision, fetal heart tones were 150.  Abdomen was marked.  Pfannenstiel skin incision was made with a scalpel and carried down to the underlying layer of the fascia and then extended laterally with the curved Mayo scissors.  Rectus muscles were separated. Peritoneum was identified and entered sharply.  Alexis retractor was placed.  Initially, when we entered, there was a small looked like a hematoma on the lateral left-hand side, but the bladder and the lower uterine segment appeared to be intact.  The transverse skin incision was made above the bladder flap and once the incision was made, I was able to bluntly open the very thin lower uterine segment.  The baby was deep in the pelvis, so a hand from below was obtained.  The baby was brought to the incision.  When I opened the uterus, the cord appeared to be around the neck twice and that was loosened.  Baby was then delivered atraumatically.  Cord was clamped x2 and cut.  There was a cry on the field.  The baby was handed off to the awaiting NICU team.  Cord gases were obtained.  Later, it was again a clotted specimen.  Placenta was delivered and was eventually sent to pass.  Trailing membranes were removed.  The uterus was cleared of all clots and debris.  There was significant bleeding.  There was a deep incision or extension down to the cervix.  There was a bladder injury because it was reported that there was blood in the urine.  Sterile milk was then infused into the bladder, 250 mL.  Bladder did not show any injury to the serosa.  No leakage at all.  Bladder blade was then used to retract that and the cervical incision was then closed with 0 Vicryl to the hysterotomy incision.  It did extend deep behind the bladder.  The hysterotomy incision was then closed and it was reinforced with a layer of  imbrication.  I went back to imbricate the cervical incision and that was made hemostatic.  Again, the bladder was slowly distended and at the end of the procedure, the urine appeared pink and clear and was starting to lose the hematuria.  The uterus was cleared of all clots and debris.  Ovaries and tubes were normal.  I could not identify the peritoneum as it was adhered to the rectus muscles, so the rectus muscles were plicated and then U-stitched in 2 interrupted stitches.  Hemostasis of the rectus muscle was achieved.  Fascia was reapproximated with 0 Vicryl in a continuous locked fashion. Subcutaneous space was closed with 2-0 plain gut.  Skin was reapproximated with 4-0 Monocryl in a subcuticular fashion.  Steri- Strips, benzoin, and pressure dressings were applied.  Due to the low cervical tear, wanted to make sure that there was no significant bleeding from below.  The wound was dressed and the patient was frog-legged, then a Graves speculum was inserted into the vagina. There was dark watery blood that came out initially, but then the cervix was visualized, appeared normal.  No active bleeding was noted.  SCDs were on in the entire case.  Antibiotics administered prior to the surgery.  Instrument, sponge, and needle counts were correct x3.  The patient tolerated the procedure well.  Baby remained in the operating room in stable condition with the parents.     Pieter Partridge, MD     EBV/MEDQ  D:  10/08/2016  T:  10/08/2016  Job:  [id number]

## 2016-10-08 NOTE — Progress Notes (Signed)
Subjective:  Postpartum Day 1: Cesarean Delivery  Patient reports tolerating PO.    The patient is able to ambulate without difficulty. She denies orthostatic changes. Pain is well controlled.  Objective: Vital signs in last 24 hours: Temp:  [97.9 F (36.6 C)-99 F (37.2 C)] 97.9 F (36.6 C) (09/10 0558) Pulse Rate:  [67-100] 67 (09/10 0558) Resp:  [16-18] 18 (09/10 0558) BP: (117-136)/(60-83) 117/60 (09/10 0558) SpO2:  [96 %-99 %] 97 % (09/09 2003)  Physical Exam:  General: no distress Lochia: appropriate Uterine Fundus: firm Incision: Dressing is clean and dry DVT Evaluation: No evidence of DVT seen on physical exam.   Recent Labs  10/07/16 0947 10/08/16 0713  HGB 9.0* 7.3*  HCT 26.6* 21.5*    Assessment/Plan:  Status post Cesarean section. Doing well postoperatively.   Postoperative anemia.  Uncontrolled diabetes.  We will begin glyburide because her fasting blood sugars continue to be high.  Blood transfusion discussed. Risk and benefits reviewed. Questions answered. We will check orthostatic blood pressures. Declined for now.  The baby is currently in the NICU. MRI is scheduled for today. There is no evidence of meningitis and there is no evidence of a skull fracture.  Cashus Halterman V 10/08/2016, 8:50 AM

## 2016-10-08 NOTE — Plan of Care (Signed)
Problem: Activity: Goal: Will verbalize the importance of balancing activity with adequate rest periods Outcome: Completed/Met Date Met: 10/08/16 Discussed the importance of increasing ambulation in hallway today to assist with gastric motility and pain.

## 2016-10-08 NOTE — Clinical Social Work Maternal (Signed)
  CLINICAL SOCIAL WORK MATERNAL/CHILD NOTE  Patient Details  Name: [name] MRN: [id number] Date of Birth: 02-Jun-1985  Date:  10/08/2016  Clinical Social Worker Initiating Note:  Laurey Arrow Date/ Time Initiated:  10/08/16/1110     Child's Name:  [name]   Legal Guardian:  Mother   Need for Interpreter:  None   Date of Referral:  10/08/16     Reason for Referral:  Late or No Prenatal Care , Other (Comment) (MOB scored a 9 on EDPS and NICU admission)   Referral Source:  NICU   Address:  [address] 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  MOB introduced her room guest as MOB's mother.  MOB gave CSW permission to speak with MOB while MOB's mother was present. CSW inquired about barriers, needs, and concerns and both MOB denied them.  MOB communicated that they have everything they need for the infant and MOB feels prepared to parent.  MOB became tearful as MOB spoke about infant's NICU admission and MOB discharging  from the hospital without infant. CSW normalized and validated MOB's thoughts and feelings.  CSW reviewed NICU visitation policy and encouraged MOB to visit as often as she likes. MOB reports a wealth of supporters and was hopeful about infant's medical condition.  MOB's mother was supportive during assessment and assured MOB that other family members will continue to provide daily support. CSW provided education regarding Baby Blues vs PMADs. CSW encouraged MOB to evaluate her mental health throughout the postpartum period with the use of the New Mom Checklist developed by Postpartum Progress and notify a medical professional if symptoms arise.  CSW appeared to have insight and awareness and did not present with any acute MH signs or symptoms.  CSW provided MOB with CSW contact information and encouraged MOB to contact CSW if a need arise.   CSW will continue to offer MOB resources and support while infant remains in [place].  CSW Plan/Description:  Information/Referral to [employer] , Engineer, mining , Psychosocial Support and Ongoing Assessment of Needs   Laurey Arrow, MSW, LCSW Clinical Social Work [phone]   Dimple Nanas, LCSW 10/08/2016, 11:53 AM

## 2016-10-09 LAB — GLUCOSE, CAPILLARY
GLUCOSE-CAPILLARY: 59 mg/dL — AB (ref 65–99)
GLUCOSE-CAPILLARY: 90 mg/dL (ref 65–99)
GLUCOSE-CAPILLARY: 112 mg/dL — AB (ref 65–99)
Glucose-Capillary: 88 mg/dL (ref 65–99)
Glucose-Capillary: 132 mg/dL — ABNORMAL HIGH (ref 65–99)

## 2016-10-09 LAB — CBC
HEMATOCRIT: 22 % — AB (ref 36.0–46.0)
HEMOGLOBIN: 7.2 g/dL — AB (ref 12.0–15.0)
MCH: 27.4 pg (ref 26.0–34.0)
MCHC: 32.7 g/dL (ref 30.0–36.0)
MCV: 83.7 fL (ref 78.0–100.0)
Platelets: 183 10*3/uL (ref 150–400)
RBC: 2.63 MIL/uL — AB (ref 3.87–5.11)
RDW: 14.7 % (ref 11.5–15.5)
WBC: 9.1 10*3/uL (ref 4.0–10.5)

## 2016-10-09 MED ORDER — GLYBURIDE MICRONIZED 1.5 MG PO TABS
2.5000 mg | ORAL_TABLET | Freq: Two times a day (BID) | ORAL | Status: DC
  Filled 2016-10-09: qty 1.5

## 2016-10-09 NOTE — Plan of Care (Signed)
Problem: Education: Goal: Knowledge of condition will improve Patient ambulating frequently to visit the baby in NICU. Pain under control with only ibuprofen. Encouraged patient to call if pain increases and more medication needed.

## 2016-10-09 NOTE — Progress Notes (Addendum)
Inpatient Diabetes Program Recommendations  AACE/ADA: New Consensus Statement on Inpatient Glycemic Control (2015)  Target Ranges:  Prepandial:   less than 140 mg/dL      Peak postprandial:   less than 180 mg/dL (1-2 hours)      Critically ill patients:  140 - 180 mg/dL   Results for [name], [name] (MRN [id number]) as of 10/09/2016 08:44  Ref. Range 10/07/2016 08:52 10/07/2016 20:03 10/07/2016 22:12 10/08/2016 06:01 10/09/2016 07:00 10/09/2016 07:23  Glucose-Capillary Latest Ref Range: 65 - 99 mg/dL 409188 (H) 811135 (H) 914128 (H) 130 (H) 59 (L) 90   Review of Glycemic Control  Diabetes history: GDM Outpatient Diabetes medications: Glyburide 2.5 mg QAM, Glyburide 5 mg QPM Current orders for Inpatient glycemic control: Glyburide 2.25 mg BID  Inpatient Diabetes Program Recommendations: Oral Agents: Noted Glyburide dose was decreased this morning. May want to consider discontinuing Glyburide and follow glucose trends to determine if Glyburide needed at all now that patient has delievered.  NOTE: In reviewing the chart, noted patient received Betamethasone 10 mg on 10/06/16 and she did not receive any Glyburide on 10/06/16 or 10/07/16. Patient did receive Glyburide 2.5 mg in am and Glyburide 5 mg in evening on 10/08/16 and fasting glucose 59 mg/dl this morning. Since patient has delivered, anticipate patient likely does not need Glyburide at this time. Would recommend patient follow up with PCP to continue to follow glycemic control as an outpatient.  Thanks, Taylor PennerMarie Kanton Kamel, RN, MSN, CDE Diabetes Coordinator Inpatient Diabetes Program [phone] (Team Pager from 8am to 5pm)

## 2016-10-09 NOTE — Progress Notes (Addendum)
Taylor AltesVictoria Gonzales [id number]  Subjective: Postpartum Day 2: [name]; GDM on Glyburide  Patient up ad lib, reports no syncope or dizziness.  Visiting infant frequently in NICU.  Baby stable, being evaluated for possible ICH. Feeding:  Pumping Contraceptive plan:  Patient unsure at present  Objective: Temp:  [98.7 F (37.1 C)] 98.7 F (37.1 C) (09/11 0623) Pulse Rate:  [80] 80 (09/11 0623) Resp:  [18] 18 (09/11 0623) BP: (125)/(69) 125/69 (09/11 0623)  CBC Latest Ref Rng & Units 10/09/2016 10/08/2016 10/07/2016  WBC 4.0 - 10.5 K/uL 9.1 10.0 16.1(H)  Hemoglobin 12.0 - 15.0 g/dL 7.2(L) 7.3(L) 9.0(L)  Hematocrit 36.0 - 46.0 % 22.0(L) 21.5(L) 26.6(L)  Platelets 150 - 400 K/uL 183 171 196     Physical Exam:  General: alert Lochia: appropriate Uterine Fundus: firm Abdomen:  + bowel sounds Incision: Honeycomb dressing CDI DVT Evaluation: No evidence of DVT seen on physical exam. Negative Homan's sign.  CBG (last 3)   Recent Labs  10/09/16 0700 10/09/16 0723 10/09/16 0946  GLUCAP 59* 90 88  AM glyburide dose held.     Assessment/Plan: Status post cesarean delivery, day 2--[facility], GDM on Glyburide Varying CBGs NICU infant. Anemia due to acute blood loss, hemodynamically stable, declines transfusion. Stable Continue current care. Per consult with Dr. Stefano Gonzales, will decrease Glyburide to 2.5 mg po BID. Plan for discharge tomorrow    Taylor BridgemanLATHAM, Taylor Flott MSN, CNM 10/09/2016, 10a  Addendum: Appreciate diabetic consultant note. 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  Taylor BridgemanVicki Meshilem Gonzales, CNM 10/09/16 11:45a

## 2016-10-09 NOTE — Progress Notes (Signed)
Notified Nigel BridgemanVicki Latham, CNM of fasting blood sugar this morning of 59. Patient ate a snack immediately after and rechecked in 15 minutes. Follow up blood sugar was 90. Chip BoerVicki ordered routine fasting blood sugars and two hour post prandial blood sugars. Discussed plan with patient. Will continue to monitor. Earl Galasborne, Linda HedgesStefanie China GroveHudspeth

## 2016-10-10 LAB — GLUCOSE, CAPILLARY
GLUCOSE-CAPILLARY: 90 mg/dL (ref 65–99)
Glucose-Capillary: 100 mg/dL — ABNORMAL HIGH (ref 65–99)

## 2016-10-10 MED ORDER — IBUPROFEN 600 MG PO TABS: 600.0000 mg | ORAL_TABLET | Freq: Four times a day (QID) | ORAL | 0 refills | Status: AC

## 2016-10-10 MED ORDER — OXYCODONE-ACETAMINOPHEN 5-325 MG PO TABS: 1.0000 | ORAL_TABLET | ORAL | 0 refills | Status: DC | PRN

## 2016-10-10 NOTE — Discharge Summary (Signed)
OB Discharge Summary     Patient Name: [name] DOB: 1986-01-25 MRN: [id number]  Date of admission: 10/06/2016 Delivering MD: Geryl Rankins   Date of discharge: 10/10/2016  Admitting diagnosis: [redacted]w[redacted]d, [place]  Intrauterine pregnancy: [redacted]w[redacted]d     Secondary diagnosis:  Principal Problem:   Status post primary low transverse cesarean section--NRFHR 2018 Active Problems:   Indication for care or intervention in labor or delivery  Additional problems:Infant in NICU     Discharge diagnosis: Term Pregnancy Delivered and Anemia                                                                                                Post partum procedures:None  Augmentation: Pitocin  Complications: None  Hospital course:  Onset of Labor With Unplanned C/S  31 y.o. yo G2P2002 at [redacted]w[redacted]d was admitted in Latent Labor on 10/06/2016. Patient had a labor course significant for nonreassuring fetal status. Membrane Rupture Time/Date: 9:43 PM ,10/06/2016   The patient went for cesarean section due to Non-Reassuring FHR, and delivered a Viable infant,10/07/2016  Details of operation can be found in separate operative note. Patient had an uncomplicated postpartum course.  She is ambulating,tolerating a regular diet, passing flatus, and urinating well.  Patient is discharged home in stable condition 10/10/16.  Physical exam  Vitals:   10/08/16 0558 10/09/16 0623 10/09/16 1848 10/09/16 2327  BP: 117/60 125/69 (!) 141/80 (!) 147/89  Pulse: 67 80 96 91  Resp: Temp: 97.9 F (36.6 C) 98.7 F (37.1 C) 98.8 F (37.1 C) 98.3 F (36.8 C)  TempSrc: Axillary Oral Oral Oral  SpO2:      Weight:      Height:       General: alert, cooperative and no distress Lochia: appropriate Uterine Fundus: firm Incision: Dressing is clean, dry, and intact DVT Evaluation: No evidence of DVT seen on physical exam. Negative Homan's sign. Labs: Lab Results  Component Value Date   WBC 9.1 10/09/2016   HGB  7.2 (L) 10/09/2016   HCT 22.0 (L) 10/09/2016   MCV 83.7 10/09/2016   PLT 183 10/09/2016   CMP Latest Ref Rng & Units 10/06/2016  Glucose 65 - 99 mg/dL 409(W)  BUN 6 - 20 mg/dL 7  Creatinine 1.19 - 1.47 mg/dL 8.29  Sodium 562 - 130 mmol/L 135  Potassium 3.5 - 5.1 mmol/L 4.0  Chloride 101 - 111 mmol/L 102  CO2 22 - 32 mmol/L 24  Calcium 8.9 - 10.3 mg/dL 9.1  Total Protein 6.5 - 8.1 g/dL 6.5  Total Bilirubin 0.3 - 1.2 mg/dL 0.8  Alkaline Phos 38 - 126 U/L 119  AST 15 - 41 U/L 23  ALT 14 - 54 U/L 13(L)    Discharge instruction: per After Visit Summary and "Baby and Me Booklet".  After visit meds:  Allergies as of 10/10/2016      Reactions   Banana Nausea And Vomiting   Patient also experiences severe abdominal pain      Medication List    STOP taking these medications   glyBURIDE 2.5 MG tablet  Commonly known as:  DIABETA     TAKE these medications   diphenhydrAMINE 25 MG tablet Commonly known as:  BENADRYL Take 50 mg by mouth every 6 (six) hours as needed for allergies.   HM VITAMIN D3 4000 units Caps Generic drug:  Cholecalciferol Take 4,000 Units by mouth daily.   hydrocortisone cream 1 % Apply 1 application topically 2 (two) times daily as needed for itching.   ibuprofen 600 MG tablet Commonly known as:  ADVIL,MOTRIN Take 1 tablet (600 mg total) by mouth every 6 (six) hours.   oxyCODONE-acetaminophen 5-325 MG tablet Commonly known as:  PERCOCET/ROXICET Take 1 tablet by mouth every 4 (four) hours as needed (pain scale 4-7).   prenatal multivitamin Tabs tablet Take 1 tablet by mouth daily at 12 noon.            Discharge Care Instructions        Start     Ordered   10/10/16 0000  ibuprofen (ADVIL,MOTRIN) 600 MG tablet  Every 6 hours     10/10/16 0630   10/10/16 0000  oxyCODONE-acetaminophen (PERCOCET/ROXICET) 5-325 MG tablet  Every 4 hours PRN     10/10/16 0630   10/10/16 0000  Diet - low sodium heart healthy     10/10/16 0630   10/10/16 0000   Activity as tolerated     10/10/16 0630   10/10/16 0000  Discharge wound care:    Comments:  Remove honeycomb dressing day 5 to 7.  Keep incision clean with soap and water.  Keep dry.   10/10/16 0630   10/06/16 0000  OB RESULT CONSOLE Group B Strep    Comments:  This external order was created through the Results Console.   10/06/16 0813      Diet: carb modified diet  Activity: Advance as tolerated. Pelvic rest for 6 weeks.   Outpatient follow up:6 weeks Follow up Appt:No future appointments. Follow up Visit:No Follow-up on file.  Postpartum contraception: Undecided  Newborn Data: Live born female  Birth Weight: 8 lb (3629 g) APGAR: 7, 8  Baby Feeding: Breast Disposition:NICU   10/10/2016 Kenney HousemanNancy Jean Teagon Kron, CNM

## 2016-10-10 NOTE — Lactation Note (Signed)
This note was copied from a baby's chart. Lactation Consultation Note  Patient Name: [name] [id number] Date: 10/10/2016 Reason for consult: Follow-up assessment  NICU baby 2 hours old. Mom reports that she is about to be D/C'd and has no need for Lactation at this time. Enc mom to take pumping kit with her, and she is aware of pumping rooms in the NICU. Mom states that she is able to pump around 50 ml of EBM each time that she pumps. Mom aware of OP/BFSG and [place] phone line assistance after D/C.   Maternal Data    Feeding Feeding Type: Breast Milk with Formula added Nipple Type: Regular Length of feed: 20 min  LATCH Score                   Interventions    Lactation Tools Discussed/Used     Consult Status Consult Status: Complete    Andres Labrum 10/10/2016, 2:13 PM

## 2016-11-09 ENCOUNTER — Ambulatory Visit (INDEPENDENT_AMBULATORY_CARE_PROVIDER_SITE_OTHER): Payer: BC Managed Care – PPO | Admitting: Primary Care

## 2016-11-09 ENCOUNTER — Encounter: Payer: Self-pay | Admitting: Primary Care

## 2016-11-09 VITALS — BP 122/78 | HR 66 | Temp 98.4°F | Ht 66.0 in | Wt 209.4 lb

## 2016-11-09 DIAGNOSIS — H6123 Impacted cerumen, bilateral: Secondary | ICD-10-CM

## 2016-11-09 NOTE — Patient Instructions (Signed)
Consider using Debrox drops and/or ear wax removal kits in the future.  Nasal Congestion/Ear Pressure: Try using Flonase (fluticasone) nasal spray. Instill 1 spray in each nostril twice daily.   Try an antihistamine such as Claritin, Allegra, Zyrtec these can help dry out fluid behind the ear drum.  It was a pleasure to see you today!   Earwax Buildup, Adult The ears produce a substance called earwax that helps keep bacteria out of the ear and protects the skin in the ear canal. Occasionally, earwax can build up in the ear and cause discomfort or hearing loss. What increases the risk? This condition is more likely to develop in people who:  Are female.  Are elderly.  Naturally produce more earwax.  Clean their ears often with cotton swabs.  Use earplugs often.  Use in-ear headphones often.  Wear hearing aids.  Have narrow ear canals.  Have earwax that is overly thick or sticky.  Have eczema.  Are dehydrated.  Have excess hair in the ear canal.  What are the signs or symptoms? Symptoms of this condition include:  Reduced or muffled hearing.  A feeling of fullness in the ear or feeling that the ear is plugged.  Fluid coming from the ear.  Ear pain.  Ear itch.  Ringing in the ear.  Coughing.  An obvious piece of earwax that can be seen inside the ear canal.  How is this diagnosed? This condition may be diagnosed based on:  Your symptoms.  Your medical history.  An ear exam. During the exam, your health care provider will look into your ear with an instrument called an otoscope.  You may have tests, including a hearing test. How is this treated? This condition may be treated by:  Using ear drops to soften the earwax.  Having the earwax removed by a health care provider. The health care provider may: ? Flush the ear with water. ? Use an instrument that has a loop on the end (curette). ? Use a suction device.  Surgery to remove the wax buildup.  This may be done in severe cases.  Follow these instructions at home:  Take over-the-counter and prescription medicines only as told by your health care provider.  Do not put any objects, including cotton swabs, into your ear. You can clean the opening of your ear canal with a washcloth or facial tissue.  Follow instructions from your health care provider about cleaning your ears. Do not over-clean your ears.  Drink enough fluid to keep your urine clear or pale yellow. This will help to thin the earwax.  Keep all follow-up visits as told by your health care provider. If earwax builds up in your ears often or if you use hearing aids, consider seeing your health care provider for routine, preventive ear cleanings. Ask your health care provider how often you should schedule your cleanings.  If you have hearing aids, clean them according to instructions from the manufacturer and your health care provider. Contact a health care provider if:  You have ear pain.  You develop a fever.  You have blood, pus, or other fluid coming from your ear.  You have hearing loss.  You have ringing in your ears that does not go away.  Your symptoms do not improve with treatment.  You feel like the room is spinning (vertigo). Summary  Earwax can build up in the ear and cause discomfort or hearing loss.  The most common symptoms of this condition include reduced or  muffled hearing and a feeling of fullness in the ear or feeling that the ear is plugged.  This condition may be diagnosed based on your symptoms, your medical history, and an ear exam.  This condition may be treated by using ear drops to soften the earwax or by having the earwax removed by a health care provider.  Do not put any objects, including cotton swabs, into your ear. You can clean the opening of your ear canal with a washcloth or facial tissue. This information is not intended to replace advice given to you by your health care  provider. Make sure you discuss any questions you have with your health care provider. Document Released: 02/23/2004 Document Revised: 03/28/2016 Document Reviewed: 03/28/2016 Elsevier Interactive Patient Education  [employer].

## 2016-11-09 NOTE — Progress Notes (Signed)
Subjective:    Patient ID: [name], female    DOB: [birth year], 31 y.o.   MRN: [id number]  HPI  [name] is a 31 year old female with a history of asthma who presents today with a chief complaint of ear fullness. The fullness is located to the left ear which has been present intermittently for the past several months with a return one week ago. She's tried yawning, chewing gum, blowing her nose, cleaning out her ear, pulling on her ear lobe without much improvement. She's not taking anything OTC for her symptoms.   She denies cough, sore throat, sinus pressure, rhinorrhea.   Review of Systems  Constitutional: Negative for fever.  HENT: Negative for congestion, ear pain, sinus pressure and sore throat.        Left ear fullness  Respiratory: Negative for cough and shortness of breath.   Cardiovascular: Negative for chest pain.       Past Medical History:  Diagnosis Date  . Depression   . Exercise-induced asthma   . GERD (gastroesophageal reflux disease)   . Gestational diabetes    glyburide  . Hx of pre-eclampsia in prior pregnancy, currently pregnant   . Obesity   . Palpitations    normal cardiologist visit     Social History   Social History  . Marital status: Married    Spouse name: N/A  . Number of children: 1  . Years of education: N/A   Occupational History  . Not on file.   Social History Main Topics  . Smoking status: Never Smoker  . Smokeless tobacco: Never Used  . Alcohol use Yes     Comment: occas. when not preg  . Drug use: No  . Sexual activity: Not on file   Other Topics Concern  . Not on file   Social History Narrative   Married.   1 son, pregnant.   Works as a Customer service manager, teaches high school computers.   Enjoys watching TV, reading, exercising.     Past Surgical History:  Procedure Laterality Date  . BREAST LUMPECTOMY    . CESAREAN SECTION N/A 10/04/2014   Procedure: CESAREAN SECTION;  Surgeon: Jaymes Graff, MD;  Location:  [facility];  Service: Obstetrics;  Laterality: N/A;  . CESAREAN SECTION N/A 10/07/2016   Procedure: CESAREAN SECTION;  Surgeon: Geryl Rankins, MD;  Location: [facility];  Service: Obstetrics;  Laterality: N/A;    Family History  Problem Relation Age of Onset  . Hypertension Mother   . Breast cancer Paternal Aunt   . Healthy Father   . Rheum arthritis Sister   . Endometriosis Sister   . Anxiety disorder Sister   . Miscarriages / Stillbirths Sister   . Post-traumatic stress disorder Sister   . Other Sister        BENIGN BRAIN TUMOR  . Diabetes Paternal Aunt     Allergies  Allergen Reactions  . Banana Nausea And Vomiting    Patient also experiences severe abdominal pain    Current Outpatient Prescriptions on File Prior to Visit  Medication Sig Dispense Refill  . Prenatal Vit-Fe Fumarate-FA (PRENATAL MULTIVITAMIN) TABS tablet Take 1 tablet by mouth daily at 12 noon.    . diphenhydrAMINE (BENADRYL) 25 MG tablet Take 50 mg by mouth every 6 (six) hours as needed for allergies.    . hydrocortisone cream 1 % Apply 1 application topically 2 (two) times daily as needed for itching.     Marland Kitchen ibuprofen (ADVIL,MOTRIN)  600 MG tablet Take 1 tablet (600 mg total) by mouth every 6 (six) hours. (Patient not taking: Reported on 11/09/2016) 30 tablet 0   No current facility-administered medications on file prior to visit.     BP 122/78   Pulse 66   Temp 98.4 F (36.9 C) (Oral)   Ht  (1.676 m)   Wt 209 lb 6.4 oz (95 kg)   LMP  (Exact Date)   SpO2 96%   Breastfeeding? Yes   BMI 33.80 kg/m    Objective:   Physical Exam  Constitutional: She appears well-nourished.  HENT:  Right Ear: Tympanic membrane normal.  Left Ear: Tympanic membrane normal.  Nose: Right sinus exhibits no maxillary sinus tenderness and no frontal sinus tenderness. Left sinus exhibits no maxillary sinus tenderness and no frontal sinus tenderness.  Mouth/Throat: Oropharynx is clear and moist.  Bilateral cerumen  impaction to canals. TM's post irrigation unremarkable. Canals with mild irritation from adherence of cerumen.   Eyes: Conjunctivae are normal.  Neck: Neck supple.  Cardiovascular: Normal rate and regular rhythm.   Pulmonary/Chest: Effort normal and breath sounds normal. She has no wheezes. She has no rales.  Lymphadenopathy:    She has no cervical adenopathy.  Skin: Skin is warm and dry.          Assessment & Plan:  Cerumen Impaction:  Ear fullness to left ear x 1 week. Exam today with cerumen impaction to bilateral canals. Canals and TM's post irrigation Discussed use of Debrox drops. Follow up PRN.  Morrie Sheldon, NP

## 2017-01-03 ENCOUNTER — Ambulatory Visit: Payer: Self-pay | Admitting: *Deleted

## 2017-01-03 NOTE — Telephone Encounter (Signed)
I called pt and for couple of days on and off has heavy feeling in chest ? Anxiety; also lightheaded on and off with vision "shifting" not  Blurred for few seconds on and off. Pt cannot come in until 01/04/17 at 10:30; offered earlier appts but pt could not fit into schedule. If pt condition changes or worsens prior to appt [facility].

## 2017-01-03 NOTE — Telephone Encounter (Signed)
   Reason for Disposition . [1] Symptoms of anxiety or panic AND [2] has not been evaluated for this by physician  Answer Assessment - Initial Assessment Questions 1. CONCERN: "What happened that made you call today?"     Patient has had anxiety before- she feels like this again- but with added symtoms 2. ANXIETY SYMPTOM SCREENING: "Can you describe how you have been feeling?"  (e.g., tense, restless, panicky, anxious, keyed up, trouble sleeping, trouble concentrating)     Chest pressure- anxiety, dizziness- every few hours, vision/eye changes- happen together-patient is nursing 3. ONSET: "How long have you been feeling this way?"     2 weeks- symptoms 2 days ago 4. RECURRENT: "Have you felt this way before?"  If yes: "What happened that time?" "What helped these feelings go away in the past?"      2 years ago- breathing, self control 5. RISK OF HARM - SUICIDAL IDEATION:  "Do you ever have thoughts of hurting or killing yourself?"  (e.g., yes, no, no but preoccupation with thoughts about death)   - INTENT:  "Do you have thoughts of hurting or killing yourself right NOW?" (e.g., yes, no, N/A)   - PLAN: "Do you have a specific plan for how you would do this?" (e.g., gun, knife, overdose, no plan, N/A)     No 6. RISK OF HARM - HOMICIDAL IDEATION:  "Do you ever have thoughts of hurting or killing someone else?"  (e.g., yes, no, no but preoccupation with thoughts about death)   - INTENT:  "Do you have thoughts of hurting or killing someone right NOW?" (e.g., yes, no, N/A)   - PLAN: "Do you have a specific plan for how you would do this?" (e.g., gun, knife, no plan, N/A)      no 7. FUNCTIONAL IMPAIRMENT: "How have things been going for you overall in your life? Have you had any more difficulties than usual doing your normal daily activities?"  (e.g., better, same, worse; self-care, school, work, interactions)     Life changes- husband is in the services, patient has newborn and small child, possible  job change 8. SUPPORT: "Who is with you now?" "Who do you live with?" "Do you have family or friends nearby who you can talk to?"      Family is in another state- patient does have friend in area [place]. THERAPIST: "Do you have a counselor or therapist? Name?"     Patient has counselor 10. STRESSORS: "Has there been any new stress or recent changes in your life?"       Yes- see above 11. CAFFEINE ABUSE: "Do you drink caffeinated beverages, and how much each day?" (e.g., coffee, tea, colas)       Coffee- but patient is breastfeeding so she is aware of intake 12. SUBSTANCE ABUSE: "Do you use any illegal drugs or alcohol?"       No drugs- occasional glass of wine 13. OTHER SYMPTOMS: "Do you have any other physical symptoms right now?" (e.g., chest pain, palpitations, difficulty breathing, fever)       Vision changes- with dizziness, left shoulder- painful 14. PREGNANCY: "Is there any chance you are pregnant?" "When was your last menstrual period?"       No- withdrawl  Protocols used: ANXIETY AND PANIC ATTACK-A-AH

## 2017-01-04 ENCOUNTER — Encounter (INDEPENDENT_AMBULATORY_CARE_PROVIDER_SITE_OTHER): Payer: Self-pay

## 2017-01-04 ENCOUNTER — Encounter: Payer: Self-pay | Admitting: Family Medicine

## 2017-01-04 ENCOUNTER — Ambulatory Visit: Payer: BC Managed Care – PPO | Admitting: Family Medicine

## 2017-01-04 VITALS — BP 124/88 | HR 71 | Temp 98.2°F | Wt 212.8 lb

## 2017-01-04 DIAGNOSIS — O24415 Gestational diabetes mellitus in pregnancy, controlled by oral hypoglycemic drugs: Secondary | ICD-10-CM | POA: Diagnosis not present

## 2017-01-04 DIAGNOSIS — D649 Anemia, unspecified: Secondary | ICD-10-CM | POA: Diagnosis not present

## 2017-01-04 DIAGNOSIS — R0789 Other chest pain: Secondary | ICD-10-CM

## 2017-01-04 LAB — CBC WITH DIFFERENTIAL/PLATELET
BASOS ABS: 0 10*3/uL (ref 0.0–0.1)
Basophils Relative: 0.9 % (ref 0.0–3.0)
Eosinophils Absolute: 0.2 10*3/uL (ref 0.0–0.7)
Eosinophils Relative: 3.6 % (ref 0.0–5.0)
HCT: 39.7 % (ref 36.0–46.0)
Hemoglobin: 12.6 g/dL (ref 12.0–15.0)
LYMPHS ABS: 2 10*3/uL (ref 0.7–4.0)
Lymphocytes Relative: 38.7 % (ref 12.0–46.0)
MCHC: 31.7 g/dL (ref 30.0–36.0)
MCV: 80.6 fl (ref 78.0–100.0)
MONO ABS: 0.4 10*3/uL (ref 0.1–1.0)
Monocytes Relative: 7.4 % (ref 3.0–12.0)
NEUTROS PCT: 49.4 % (ref 43.0–77.0)
Neutro Abs: 2.6 10*3/uL (ref 1.4–7.7)
Platelets: 285 10*3/uL (ref 150.0–400.0)
RBC: 4.93 Mil/uL (ref 3.87–5.11)
RDW: 14.9 % (ref 11.5–15.5)
WBC: 5.2 10*3/uL (ref 4.0–10.5)

## 2017-01-04 LAB — COMPREHENSIVE METABOLIC PANEL
ALK PHOS: 68 U/L (ref 39–117)
ALT: 25 U/L (ref 0–35)
AST: 22 U/L (ref 0–37)
Albumin: 4.3 g/dL (ref 3.5–5.2)
BILIRUBIN TOTAL: 0.6 mg/dL (ref 0.2–1.2)
BUN: 12 mg/dL (ref 6–23)
CO2: 32 mEq/L (ref 19–32)
Calcium: 9.3 mg/dL (ref 8.4–10.5)
Chloride: 104 mEq/L (ref 96–112)
Creatinine, Ser: 0.84 mg/dL (ref 0.40–1.20)
GFR: 100.91 mL/min (ref >60.00)
GLUCOSE: 86 mg/dL (ref 70–99)
Potassium: 4.6 mEq/L (ref 3.5–5.1)
SODIUM: 140 meq/L (ref 135–145)
TOTAL PROTEIN: 7.1 g/dL (ref 6.0–8.3)

## 2017-01-04 LAB — HEMOGLOBIN A1C: Hgb A1c MFr Bld: 5.6 % (ref 4.6–6.5)

## 2017-01-04 LAB — TSH: TSH: 0.98 u[IU]/mL (ref 0.35–4.50)

## 2017-01-04 NOTE — Patient Instructions (Signed)
I will be in touch about your blood work results  Please drink enough liquids to make your urine light yellow

## 2017-01-04 NOTE — Progress Notes (Signed)
Subjective:    Patient ID: [name], female    DOB: 12/20/1985, 31 y.o.   MRN: [id number]  HPI This is a 31 yo female who presents today with episodes of chest heaviness. She has noticed episodes of chest heaviness and a few seconds of blurred vision, for several weeks but for past few days has been happening several times a day. Episodes last for a couple of seconds. Occasional headache, mild, has not needed medication. Episodes come out of blue, occasionally feels anxious but not always with episodes. Low energy level. Has been feeling a little light headed. No radiation of pain, no swelling of feet or ankles. No nausea or vomiting.  Was seen by cardiology 4/18 for palpitations, had negative workup with normal echocardiogram.  Is still breast feeding her three month old daughter and has two year old son.   Past Medical History:  Diagnosis Date  . Depression   . Exercise-induced asthma   . GERD (gastroesophageal reflux disease)   . Gestational diabetes    glyburide  . Hx of pre-eclampsia in prior pregnancy, currently pregnant   . Obesity   . Palpitations    normal cardiologist visit   Past Surgical History:  Procedure Laterality Date  . BREAST LUMPECTOMY    . CESAREAN SECTION N/A 10/04/2014   Procedure: CESAREAN SECTION;  Surgeon: Jaymes GraffNaima Dillard, MD;  Location: [facility];  Service: Obstetrics;  Laterality: N/A;  . CESAREAN SECTION N/A 10/07/2016   Procedure: CESAREAN SECTION;  Surgeon: Geryl RankinsVarnado, Evelyn, MD;  [facility];  Service: Obstetrics;  Laterality: N/A;   Family History  Problem Relation Age of Onset  . Hypertension Mother   . Breast cancer Paternal Aunt   . Healthy Father   . Rheum arthritis Sister   . Endometriosis Sister   . Anxiety disorder Sister   . Miscarriages / Stillbirths Sister   . Post-traumatic stress disorder Sister   . Other Sister        BENIGN BRAIN TUMOR  . Diabetes Paternal Aunt    Social History   Tobacco Use  . Smoking status:  Never Smoker  . Smokeless tobacco: Never Used  Substance Use Topics  . Alcohol use: Yes    Comment: occas. when not preg  . Drug use: No      Review of Systems Per HPI    Objective:   Physical Exam  Constitutional: She is oriented to person, place, and time. She appears well-developed and well-nourished. No distress.  HENT:  Head: Normocephalic and atraumatic.  Mouth/Throat: Oropharynx is clear and moist.  Eyes: Conjunctivae are normal.  Cardiovascular: Normal rate, regular rhythm and normal heart sounds.  Pulmonary/Chest: Effort normal and breath sounds normal.  Musculoskeletal: She exhibits no edema.  Neurological: She is alert and oriented to person, place, and time.  Skin: Skin is warm and dry. She is not diaphoretic.  Psychiatric: She has a normal mood and affect. Her behavior is normal. Judgment and thought content normal.  Vitals reviewed.  EKG- NSR, HR 73, compared to prior tracing   BP 124/88 (BP Location: Right Arm, Patient Position: Sitting, Cuff Size: Large)   Pulse 71   Temp 98.2 F (36.8 C) (Oral)   Wt 212 lb 12 oz (96.5 kg)   SpO2 97%   BMI 34.34 kg/m  Wt Readings from Last 3 Encounters:  01/04/17 212 lb 12 oz (96.5 kg)  11/09/16 209 lb 6.4 oz (95 kg)  10/06/16 234 lb (106.1 kg)   BP Readings  from Last 3 Encounters:  01/04/17 124/88  11/09/16 122/78  10/10/16 126/82       Assessment & Plan:  1. Chest heaviness - RTC/ER precautions reviewed - Encouraged her to have adequate fluid intake - EKG 12-Lead - CBC with Differential/Platelet - Comprehensive metabolic panel - Hemoglobin A1c - TSH  2. Anemia, unspecified type - CBC with Differential/Platelet  3. Gestational diabetes mellitus (GDM) controlled on oral hypoglycemic drug, antepartum - Comprehensive metabolic panel - Hemoglobin A1c   Olean Reeeborah Eliyanah Elgersma, FNP-BC  [facility]  01/08/2017 10:30 AM

## 2017-01-08 ENCOUNTER — Encounter: Payer: Self-pay | Admitting: Family Medicine

## 2017-01-08 ENCOUNTER — Ambulatory Visit: Payer: BC Managed Care – PPO | Admitting: Family Medicine

## 2017-03-12 ENCOUNTER — Encounter: Payer: Self-pay | Admitting: Primary Care

## 2017-03-12 ENCOUNTER — Ambulatory Visit (INDEPENDENT_AMBULATORY_CARE_PROVIDER_SITE_OTHER): Admitting: Primary Care

## 2017-03-12 VITALS — BP 120/76 | HR 80 | Temp 98.1°F | Ht 66.0 in | Wt 215.8 lb

## 2017-03-12 DIAGNOSIS — H6122 Impacted cerumen, left ear: Secondary | ICD-10-CM

## 2017-03-12 DIAGNOSIS — H9202 Otalgia, left ear: Secondary | ICD-10-CM | POA: Diagnosis not present

## 2017-03-12 NOTE — Progress Notes (Signed)
Subjective:    Patient ID: [name], female    DOB: 1985-10-05, 32 y.o.   MRN: [id number]  HPI  [name] is a 32 year old female with a history of GERD, cerumen impaction, asthma who presents today with a chief complaint of ear pain. Her pain is located to the left ear which has been present for the past one week. She feels as though there's pressure behind the ear, worse with yawning or belching.   She denies right ear pain, sore throat, cough, sick contacts. She's not used anything OTC for her symptoms.   Review of Systems  Constitutional: Negative for chills and fever.  HENT: Positive for ear pain. Negative for congestion, sinus pressure and sore throat.   Respiratory: Negative for cough and shortness of breath.   Cardiovascular: Negative for chest pain.       Past Medical History:  Diagnosis Date  . Depression   . Exercise-induced asthma   . GERD (gastroesophageal reflux disease)   . Gestational diabetes    glyburide  . Hx of pre-eclampsia in prior pregnancy, currently pregnant   . Obesity   . Palpitations    normal cardiologist visit     Social History   Socioeconomic History  . Marital status: Married    Spouse name: Not on file  . Number of children: 1  . Years of education: Not on file  . Highest education level: Not on file  Social Needs  . Financial resource strain: Not on file  . Food insecurity - worry: Not on file  . Food insecurity - inability: Not on file  . Transportation needs - medical: Not on file  . Transportation needs - non-medical: Not on file  Occupational History  . Not on file  Tobacco Use  . Smoking status: Never Smoker  . Smokeless tobacco: Never Used  Substance and Sexual Activity  . Alcohol use: Yes    Comment: occas. when not preg  . Drug use: No  . Sexual activity: Not on file  Other Topics Concern  . Not on file  Social History Narrative   Married.   1 son, pregnant.   Works as a Customer service managerreal estate agent, teaches high  school computers.   Enjoys watching TV, reading, exercising.     Past Surgical History:  Procedure Laterality Date  . BREAST LUMPECTOMY    . CESAREAN SECTION N/A 10/04/2014   Procedure: CESAREAN SECTION;  Surgeon: Jaymes GraffNaima Dillard, MD;  Location: [facility];  Service: Obstetrics;  Laterality: N/A;  . CESAREAN SECTION N/A 10/07/2016   Procedure: CESAREAN SECTION;  Surgeon: Geryl RankinsVarnado, Evelyn, MD;  [facility];  Service: Obstetrics;  Laterality: N/A;    Family History  Problem Relation Age of Onset  . Hypertension Mother   . Breast cancer Paternal Aunt   . Healthy Father   . Rheum arthritis Sister   . Endometriosis Sister   . Anxiety disorder Sister   . Miscarriages / Stillbirths Sister   . Post-traumatic stress disorder Sister   . Other Sister        BENIGN BRAIN TUMOR  . Diabetes Paternal Aunt     Allergies  Allergen Reactions  . Banana Nausea And Vomiting    Patient also experiences severe abdominal pain    Current Outpatient Medications on File Prior to Visit  Medication Sig Dispense Refill  . diphenhydrAMINE (BENADRYL) 25 MG tablet Take 50 mg by mouth every 6 (six) hours as needed for allergies.    .Marland Kitchen  hydrocortisone cream 1 % Apply 1 application topically 2 (two) times daily as needed for itching.     Marland Kitchen ibuprofen (ADVIL,MOTRIN) 600 MG tablet Take 1 tablet (600 mg total) by mouth every 6 (six) hours. (Patient not taking: Reported on 03/12/2017) 30 tablet 0  . Prenatal Vit-Fe Fumarate-FA (PRENATAL MULTIVITAMIN) TABS tablet Take 1 tablet by mouth daily at 12 noon.     No current facility-administered medications on file prior to visit.     BP 120/76   Pulse 80   Temp 98.1 F (36.7 C) (Oral)   Ht 5\' 6"  (1.676 m)   Wt 215 lb 12 oz (97.9 kg)   SpO2 97%   Breastfeeding? Yes   BMI 34.82 kg/m    Objective:   Physical Exam  Constitutional: She appears well-nourished.  HENT:  Right Ear: Ear canal normal. Tympanic membrane is bulging. Tympanic membrane is not  erythematous.  Left Ear: Tympanic membrane normal.  Nose: Right sinus exhibits no maxillary sinus tenderness and no frontal sinus tenderness. Left sinus exhibits no maxillary sinus tenderness and no frontal sinus tenderness.  Mouth/Throat: Oropharynx is clear and moist.  Left canal with cerumen impaction. TM post irrigation appears irritated, mild erythema, does not appear infectious.  Eyes: Conjunctivae are normal.  Neck: Neck supple.  Cardiovascular: Normal rate and regular rhythm.  Pulmonary/Chest: Effort normal and breath sounds normal. She has no wheezes. She has no rales.  Lymphadenopathy:    She has no cervical adenopathy.  Skin: Skin is warm and dry.          Assessment & Plan:  Cerumen Impaction:  Ear pain x 1 week Exam today with cerumen impaction as noted above. Discussed future use of debrox drops. Start Flonase now. Ibuprofen PRN. She will update if ear pain persists.  Doreene Nest, NP

## 2017-03-12 NOTE — Patient Instructions (Signed)
Use debrox drops in the future for ear fullness.   Ear Pressure: Try using Flonase (fluticasone) nasal spray. Instill 1 spray in each nostril twice daily.   Please send me a message Friday this week if no improvement in pain.  It was a pleasure to see you today!

## 2017-11-06 ENCOUNTER — Encounter: Payer: Self-pay | Admitting: Family Medicine

## 2017-11-06 ENCOUNTER — Ambulatory Visit (INDEPENDENT_AMBULATORY_CARE_PROVIDER_SITE_OTHER): Admitting: Family Medicine

## 2017-11-06 VITALS — BP 114/70 | HR 82 | Ht 66.0 in | Wt 218.4 lb

## 2017-11-06 DIAGNOSIS — Z8639 Personal history of other endocrine, nutritional and metabolic disease: Secondary | ICD-10-CM | POA: Diagnosis not present

## 2017-11-06 DIAGNOSIS — H6993 Unspecified Eustachian tube disorder, bilateral: Secondary | ICD-10-CM | POA: Diagnosis not present

## 2017-11-06 NOTE — Progress Notes (Signed)
Subjective:    Patient ID: [name], female    DOB: 1985/05/27, 32 y.o.   MRN: [id number]  HPI This is a 32 yo female who presents today with ear pressure for 3 days. Worse in left ear. Prior to ear pressure was having sinus drainage. Took some benadryl. No ear drainage, but ears are itchy. Used some wax removal ear drops without relief. No cough or fever. No sore throat.  She also requests follow up for thyroid nodule, reports that she had thyroid US several years ago and negative biopsy. Feels that neck has gotten larger.   Past Medical History:  Diagnosis Date  . Depression   . Exercise-induced asthma   . GERD (gastroesophageal reflux disease)   . Gestational diabetes    glyburide  . Hx of pre-eclampsia in prior pregnancy, currently pregnant   . Obesity   . Palpitations    normal cardiologist visit   Past Surgical History:  Procedure Laterality Date  . BREAST LUMPECTOMY    . CESAREAN SECTION N/A 10/04/2014   Procedure: CESAREAN SECTION;  Surgeon: Jaymes Graff, MD;  Location: [facility];  Service: Obstetrics;  Laterality: N/A;  . CESAREAN SECTION N/A 10/07/2016   Procedure: CESAREAN SECTION;  Surgeon: Geryl Rankins, MD;  Location: [facility];  Service: Obstetrics;  Laterality: N/A;   Family History  Problem Relation Age of Onset  . Hypertension Mother   . Breast cancer Paternal Aunt   . Healthy Father   . Rheum arthritis Sister   . Endometriosis Sister   . Anxiety disorder Sister   . Miscarriages / Stillbirths Sister   . Post-traumatic stress disorder Sister   . Other Sister        BENIGN BRAIN TUMOR  . Diabetes Paternal Aunt    Social History   Tobacco Use  . Smoking status: Never Smoker  . Smokeless tobacco: Never Used  Substance Use Topics  . Alcohol use: Yes    Comment: occas. when not preg  . Drug use: No      Review of Systems Per HPI    Objective:   Physical Exam  Constitutional: She is oriented to person, place, and time. She appears  well-developed and well-nourished. No distress.  HENT:  Head: Normocephalic and atraumatic.  Right Ear: External ear normal.  Left Ear: External ear normal.  Nose: Nose normal.  Mouth/Throat: Oropharynx is clear and moist.  Bilateral TM mildly dull. Canals with moderate amount cerumen, canal walls slightly flaky.   Eyes: Conjunctivae are normal.  Neck: Normal range of motion. Neck supple. Thyromegaly (right sided fullness) present.  Cardiovascular: Normal rate, regular rhythm and normal heart sounds.  Pulmonary/Chest: Effort normal and breath sounds normal.  Lymphadenopathy:    She has no cervical adenopathy.  Neurological: She is alert and oriented to person, place, and time.  Skin: Skin is warm and dry. She is not diaphoretic.  Psychiatric: She has a normal mood and affect. Her behavior is normal. Judgment and thought content normal.  Vitals reviewed.     BP 114/70 (BP Location: Right Arm, Patient Position: Sitting, Cuff Size: Normal)   Pulse 82   Ht 5\' 6"  (1.676 m)   Wt 218 lb 6.4 oz (99.1 kg)   LMP 11/05/2017   SpO2 97%   BMI 35.25 kg/m  Wt Readings from Last 3 Encounters:  11/06/17 218 lb 6.4 oz (99.1 kg)  03/12/17 215 lb 12 oz (97.9 kg)  01/04/17 212 lb 12 oz (96.5 kg)  Assessment & Plan:  1. History of thyroid nodule - reviewed previous thyroid US from 11/16 - US THYROID; Future  2. Disorder of both eustachian tubes - Provided written and verbal information regarding diagnosis and treatment. - discussed symptomatic treatment    Olean Ree, FNP-BC  [facility], [facility]  11/10/2017 7:40 PM

## 2017-11-06 NOTE — Patient Instructions (Signed)
Can use over the counter sudafed 1-2 times a day Continue with ear wax drops Use small amount over the counter hydrocortisone cream on pinkie finger to ear canal once a day for 5 to 7 days  Eustachian Tube Dysfunction The eustachian tube connects the middle ear to the back of the nose. It regulates air pressure in the middle ear by allowing air to move between the ear and nose. It also helps to drain fluid from the middle ear space. When the eustachian tube does not function properly, air pressure, fluid, or both can build up in the middle ear. Eustachian tube dysfunction can affect one or both ears. What are the causes? This condition happens when the eustachian tube becomes blocked or cannot open normally. This may result from:  Ear infections.  Colds and other upper respiratory infections.  Allergies.  Irritation, such as from cigarette smoke or acid from the stomach coming up into the esophagus (gastroesophageal reflux).  Sudden changes in air pressure, such as from descending in an airplane.  Abnormal growths in the nose or throat, such as nasal polyps, tumors, or enlarged tissue at the back of the throat (adenoids).  What increases the risk? This condition may be more likely to develop in people who smoke and people who are overweight. Eustachian tube dysfunction may also be more likely to develop in children, especially children who have:  Certain birth defects of the mouth, such as cleft palate.  Large tonsils and adenoids.  What are the signs or symptoms? Symptoms of this condition may include:  A feeling of fullness in the ear.  Ear pain.  Clicking or popping noises in the ear.  Ringing in the ear.  Hearing loss.  Loss of balance.  Symptoms may get worse when the air pressure around you changes, such as when you travel to an area of high elevation or fly on an airplane. How is this diagnosed? This condition may be diagnosed based on:  Your symptoms.  A  physical exam of your ear, nose, and throat.  Tests, such as those that measure: ? The movement of your eardrum (tympanogram). ? Your hearing (audiometry).  How is this treated? Treatment depends on the cause and severity of your condition. If your symptoms are mild, you may be able to relieve your symptoms by moving air into ("popping") your ears. If you have symptoms of fluid in your ears, treatment may include:  Decongestants.  Antihistamines.  Nasal sprays or ear drops that contain medicines that reduce swelling (steroids).  In some cases, you may need to have a procedure to drain the fluid in your eardrum (myringotomy). In this procedure, a small tube is placed in the eardrum to:  Drain the fluid.  Restore the air in the middle ear space.  Follow these instructions at home:  Take over-the-counter and prescription medicines only as told by your health care provider.  Use techniques to help pop your ears as recommended by your health care provider. These may include: ? Chewing gum. ? Yawning. ? Frequent, forceful swallowing. ? Closing your mouth, holding your nose closed, and gently blowing as if you are trying to blow air out of your nose.  Do not do any of the following until your health care provider approves: ? Travel to high altitudes. ? Fly in airplanes. ? Work in a Estate agent or room. ? Scuba dive.  Keep your ears dry. Dry your ears completely after showering or bathing.  Do not smoke.  Keep  all follow-up visits as told by your health care provider. This is important. Contact a health care provider if:  Your symptoms do not go away after treatment.  Your symptoms come back after treatment.  You are unable to pop your ears.  You have: ? A fever. ? Pain in your ear. ? Pain in your head or neck. ? Fluid draining from your ear.  Your hearing suddenly changes.  You become very dizzy.  You lose your balance. This information is not intended to  replace advice given to you by your health care provider. Make sure you discuss any questions you have with your health care provider. Document Released: 02/11/2015 Document Revised: 06/23/2015 Document Reviewed: 02/03/2014 Elsevier Interactive Patient Education  [employer].

## 2017-11-13 ENCOUNTER — Inpatient Hospital Stay: Admission: RE | Admit: 2017-11-13 | Source: Ambulatory Visit

## 2017-12-06 ENCOUNTER — Other Ambulatory Visit: Payer: Self-pay | Admitting: Family Medicine

## 2017-12-06 DIAGNOSIS — Z8639 Personal history of other endocrine, nutritional and metabolic disease: Secondary | ICD-10-CM

## 2017-12-09 ENCOUNTER — Ambulatory Visit
Admission: RE | Admit: 2017-12-09 | Discharge: 2017-12-09 | Disposition: A | Discharge status: Home / Self Care | Source: Ambulatory Visit | Attending: Family Medicine | Admitting: Family Medicine

## 2017-12-09 DIAGNOSIS — Z8639 Personal history of other endocrine, nutritional and metabolic disease: Secondary | ICD-10-CM

## 2018-01-31 ENCOUNTER — Other Ambulatory Visit: Payer: Self-pay | Admitting: Primary Care

## 2018-01-31 DIAGNOSIS — Z0000 Encounter for general adult medical examination without abnormal findings: Secondary | ICD-10-CM

## 2018-02-03 ENCOUNTER — Other Ambulatory Visit (INDEPENDENT_AMBULATORY_CARE_PROVIDER_SITE_OTHER)

## 2018-02-03 DIAGNOSIS — Z0000 Encounter for general adult medical examination without abnormal findings: Secondary | ICD-10-CM | POA: Diagnosis not present

## 2018-02-03 LAB — LIPID PANEL
CHOLESTEROL: 236 mg/dL — AB (ref 0–200)
HDL: 42.3 mg/dL (ref >39.00)
LDL CALC: 171 mg/dL — AB (ref 0–99)
NonHDL: 193.55
TRIGLYCERIDES: 114 mg/dL (ref 0.0–149.0)
Total CHOL/HDL Ratio: 6
VLDL: 22.8 mg/dL (ref 0.0–40.0)

## 2018-02-03 LAB — COMPREHENSIVE METABOLIC PANEL
ALBUMIN: 4.6 g/dL (ref 3.5–5.2)
ALK PHOS: 46 U/L (ref 39–117)
ALT: 14 U/L (ref 0–35)
AST: 16 U/L (ref 0–37)
BILIRUBIN TOTAL: 0.8 mg/dL (ref 0.2–1.2)
BUN: 18 mg/dL (ref 6–23)
CALCIUM: 10 mg/dL (ref 8.4–10.5)
CHLORIDE: 100 meq/L (ref 96–112)
CO2: 29 mEq/L (ref 19–32)
CREATININE: 0.84 mg/dL (ref 0.40–1.20)
GFR: 100.24 mL/min (ref >60.00)
Glucose, Bld: 82 mg/dL (ref 70–99)
Potassium: 4.2 mEq/L (ref 3.5–5.1)
Sodium: 136 mEq/L (ref 135–145)
TOTAL PROTEIN: 7.5 g/dL (ref 6.0–8.3)

## 2018-02-03 LAB — CBC
HCT: 39.6 % (ref 36.0–46.0)
Hemoglobin: 12.9 g/dL (ref 12.0–15.0)
MCHC: 32.5 g/dL (ref 30.0–36.0)
MCV: 81.4 fl (ref 78.0–100.0)
Platelets: 316 10*3/uL (ref 150.0–400.0)
RBC: 4.87 Mil/uL (ref 3.87–5.11)
RDW: 13.1 % (ref 11.5–15.5)
WBC: 7.4 10*3/uL (ref 4.0–10.5)

## 2018-02-07 ENCOUNTER — Encounter: Payer: Self-pay | Admitting: Primary Care

## 2018-02-07 ENCOUNTER — Ambulatory Visit (INDEPENDENT_AMBULATORY_CARE_PROVIDER_SITE_OTHER): Admitting: Primary Care

## 2018-02-07 VITALS — BP 120/76 | HR 78 | Temp 98.6°F | Ht 66.0 in | Wt 216.0 lb

## 2018-02-07 DIAGNOSIS — M545 Low back pain, unspecified: Secondary | ICD-10-CM

## 2018-02-07 DIAGNOSIS — Z0001 Encounter for general adult medical examination with abnormal findings: Secondary | ICD-10-CM | POA: Diagnosis not present

## 2018-02-07 DIAGNOSIS — Z9289 Personal history of other medical treatment: Secondary | ICD-10-CM | POA: Diagnosis not present

## 2018-02-07 DIAGNOSIS — J45990 Exercise induced bronchospasm: Secondary | ICD-10-CM | POA: Diagnosis not present

## 2018-02-07 DIAGNOSIS — R413 Other amnesia: Secondary | ICD-10-CM

## 2018-02-07 DIAGNOSIS — G8929 Other chronic pain: Secondary | ICD-10-CM | POA: Insufficient documentation

## 2018-02-07 DIAGNOSIS — E785 Hyperlipidemia, unspecified: Secondary | ICD-10-CM | POA: Insufficient documentation

## 2018-02-07 NOTE — Assessment & Plan Note (Signed)
Asymptomatic and no issues over the last year.

## 2018-02-07 NOTE — Assessment & Plan Note (Signed)
Noted on recent labs. Encouraged her to continue with dietary changes, start exercising.

## 2018-02-07 NOTE — Assessment & Plan Note (Signed)
History of abnormal scan per patient at age 33.  Has noticed memory problems over the last year. She's uncertain about where to get records of the scans as they were taken in [place] long ago. She would like to repeat the scan given the information provided at age 76. Ct head ordered. Neuro exam unremarkable.

## 2018-02-07 NOTE — Assessment & Plan Note (Signed)
Tetanus UTD, declines influenza vaccination. Pap smear UTD per patient. Recommended regular exercise, healthy diet. Exam with decrease in ROM to lower back as noted. Otherwise unremarkable. Labs reviewed. Follow up in 1 year for CPE.

## 2018-02-07 NOTE — Patient Instructions (Signed)
You will be contacted regarding your CT scan.  Please let us know if you have not been contacted within one week.   Start exercising. You should be getting 150 minutes of moderate intensity exercise weekly.  It's important to improve your diet by reducing consumption of fast food, fried food, processed snack foods, sugary drinks. Increase consumption of fresh vegetables and fruits, whole grains, water.  Ensure you are drinking 64 ounces of water daily.  Schedule a lab only appointment for repeat cholesterol check in 6 months.  It was a pleasure to see you today!   Preventive Care 18-39 Years, Female Preventive care refers to lifestyle choices and visits with your health care provider that can promote health and wellness. What does preventive care include?   A yearly physical exam. This is also called an annual well check.  Dental exams once or twice a year.  Routine eye exams. Ask your health care provider how often you should have your eyes checked.  Personal lifestyle choices, including: ? Daily care of your teeth and gums. ? Regular physical activity. ? Eating a healthy diet. ? Avoiding tobacco and drug use. ? Limiting alcohol use. ? Practicing safe sex. ? Taking vitamin and mineral supplements as recommended by your health care provider. What happens during an annual well check? The services and screenings done by your health care provider during your annual well check will depend on your age, overall health, lifestyle risk factors, and family history of disease. Counseling Your health care provider may ask you questions about your:  Alcohol use.  Tobacco use.  Drug use.  Emotional well-being.  Home and relationship well-being.  Sexual activity.  Eating habits.  Work and work Statistician.  Method of birth control.  Menstrual cycle.  Pregnancy history. Screening You may have the following tests or measurements:  Height, weight, and BMI.  Diabetes  screening. This is done by checking your blood sugar (glucose) after you have not eaten for a while (fasting).  Blood pressure.  Lipid and cholesterol levels. These may be checked every 5 years starting at age 74.  Skin check.  Hepatitis C blood test.  Hepatitis B blood test.  Sexually transmitted disease (STD) testing.  BRCA-related cancer screening. This may be done if you have a family history of breast, ovarian, tubal, or peritoneal cancers.  Pelvic exam and Pap test. This may be done every 3 years starting at age 33. Starting at age 45, this may be done every 5 years if you have a Pap test in combination with an HPV test. Discuss your test results, treatment options, and if necessary, the need for more tests with your health care provider. Vaccines Your health care provider may recommend certain vaccines, such as:  Influenza vaccine. This is recommended every year.  Tetanus, diphtheria, and acellular pertussis (Tdap, Td) vaccine. You may need a Td booster every 10 years.  Varicella vaccine. You may need this if you have not been vaccinated.  HPV vaccine. If you are 79 or younger, you may need three doses over 6 months.  Measles, mumps, and rubella (MMR) vaccine. You may need at least one dose of MMR. You may also need a second dose.  Pneumococcal 13-valent conjugate (PCV13) vaccine. You may need this if you have certain conditions and were not previously vaccinated.  Pneumococcal polysaccharide (PPSV23) vaccine. You may need one or two doses if you smoke cigarettes or if you have certain conditions.  Meningococcal vaccine. One dose is recommended if you  are age 47-21 years and a first-year college student living in a residence hall, or if you have one of several medical conditions. You may also need additional booster doses.  Hepatitis A vaccine. You may need this if you have certain conditions or if you travel or work in places where you may be exposed to hepatitis  A.  Hepatitis B vaccine. You may need this if you have certain conditions or if you travel or work in places where you may be exposed to hepatitis B.  Haemophilus influenzae type b (Hib) vaccine. You may need this if you have certain risk factors. Talk to your health care provider about which screenings and vaccines you need and how often you need them. This information is not intended to replace advice given to you by your health care provider. Make sure you discuss any questions you have with your health care provider. Document Released: 03/13/2001 Document Revised: 08/28/2016 Document Reviewed: 11/16/2014 Elsevier Interactive Patient Education  2019 [employer].

## 2018-02-07 NOTE — Assessment & Plan Note (Signed)
Since injury at [employer] in October 2019. Is following with chiropractor and doing stretching with temporary improvement. Recommended physical therapy, she will think about this and update.
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
## 2018-02-12 ENCOUNTER — Ambulatory Visit (INDEPENDENT_AMBULATORY_CARE_PROVIDER_SITE_OTHER): Admitting: Family Medicine

## 2018-02-12 ENCOUNTER — Encounter: Payer: Self-pay | Admitting: Family Medicine

## 2018-02-12 VITALS — BP 132/88 | HR 105 | Temp 99.5°F | Ht 66.0 in | Wt 211.0 lb

## 2018-02-12 DIAGNOSIS — J101 Influenza due to other identified influenza virus with other respiratory manifestations: Secondary | ICD-10-CM

## 2018-02-12 MED ORDER — OSELTAMIVIR PHOSPHATE 75 MG PO CAPS: 75.0000 mg | ORAL_CAPSULE | Freq: Two times a day (BID) | ORAL | 0 refills | Status: AC

## 2018-02-12 NOTE — Patient Instructions (Signed)

## 2018-02-12 NOTE — Progress Notes (Signed)
Subjective:     [name] is a 33 y.o. female presenting for Generalized Body Aches (Symptoms started on Monday night 02/10/2018. That night had headache, severe sweats. Yesterday fever was 100, chills, coughing, sneezing, runny nose, body aches.)     URI   This is a new problem. The current episode started in the past 7 days. The problem has been waxing and waning. The maximum temperature recorded prior to her arrival was 100.4 - 100.9 F. Associated symptoms include coughing, ear pain, headaches, nausea, a plugged ear sensation and rhinorrhea. Pertinent negatives include no abdominal pain, chest pain, congestion, sinus pain, sore throat or vomiting. She has tried NSAIDs for the symptoms. The treatment provided mild relief.     Review of Systems  Constitutional: Positive for chills, diaphoresis and fever.  HENT: Positive for ear pain and rhinorrhea. Negative for congestion, sinus pain and sore throat.   Respiratory: Positive for cough.   Cardiovascular: Negative for chest pain.  Gastrointestinal: Positive for nausea. Negative for abdominal pain and vomiting.  Musculoskeletal: Positive for arthralgias and myalgias.  Neurological: Positive for headaches.     Social History   Tobacco Use  Smoking Status Never Smoker  Smokeless Tobacco Never Used        Objective:    BP Readings from Last 3 Encounters:  02/12/18 132/88  02/07/18 120/76  11/06/17 114/70   Wt Readings from Last 3 Encounters:  02/12/18 211 lb (95.7 kg)  02/07/18 216 lb (98 kg)  11/06/17 218 lb 6.4 oz (99.1 kg)    BP 132/88   Pulse (!) 105   Temp 99.5 F (37.5 C)   Ht 5\' 6"  (1.676 m)   Wt 211 lb (95.7 kg)   LMP 02/01/2018   SpO2 96%   BMI 34.06 kg/m    Physical Exam Constitutional:      General: She is not in acute distress.    Appearance: She is well-developed. She is not diaphoretic.  HENT:     Head: Normocephalic and atraumatic.     Right Ear: Tympanic membrane and ear canal normal.       Left Ear: Tympanic membrane and ear canal normal.     Nose: Mucosal edema and rhinorrhea present.     Right Sinus: No maxillary sinus tenderness or frontal sinus tenderness.     Left Sinus: No maxillary sinus tenderness or frontal sinus tenderness.     Mouth/Throat:     Pharynx: Uvula midline. Posterior oropharyngeal erythema present. No oropharyngeal exudate.     Tonsils: Swelling: 0 on the right. 0 on the left.  Eyes:     General: No scleral icterus.    Conjunctiva/sclera: Conjunctivae normal.  Neck:     Musculoskeletal: Neck supple.  Cardiovascular:     Rate and Rhythm: Regular rhythm. Tachycardia present.     Heart sounds: Normal heart sounds. No murmur.  Pulmonary:     Effort: Pulmonary effort is normal. No respiratory distress.     Breath sounds: Normal breath sounds.  Lymphadenopathy:     Cervical: No cervical adenopathy.  Skin:    General: Skin is warm and dry.     Capillary Refill: Capillary refill takes less than 2 seconds.  Neurological:     Mental Status: She is alert.       Rapid flu: positive for B    Assessment & Plan:   Problem List Items Addressed This Visit    None    Visit Diagnoses    Influenza due  to influenza virus, type B    -  Primary   Relevant Medications   oseltamivir (TAMIFLU) 75 MG capsule     Family members were vaccinated.  Advised cleaning regularly Symptomatic care   Return if symptoms worsen or fail to improve.  Lynnda Child, MD

## 2018-02-19 ENCOUNTER — Ambulatory Visit (INDEPENDENT_AMBULATORY_CARE_PROVIDER_SITE_OTHER)
Admission: RE | Admit: 2018-02-19 | Discharge: 2018-02-19 | Disposition: A | Discharge status: Home / Self Care | Source: Ambulatory Visit | Attending: Primary Care | Admitting: Primary Care

## 2018-02-19 DIAGNOSIS — Z9289 Personal history of other medical treatment: Secondary | ICD-10-CM

## 2018-02-19 DIAGNOSIS — R413 Other amnesia: Secondary | ICD-10-CM | POA: Diagnosis not present

## 2018-07-21 ENCOUNTER — Other Ambulatory Visit: Payer: Self-pay | Admitting: *Deleted

## 2018-07-21 DIAGNOSIS — Z20822 Contact with and (suspected) exposure to covid-19: Secondary | ICD-10-CM

## 2018-07-24 NOTE — Addendum Note (Signed)
Addended by: Brigitte Pulse on: 07/24/2018 03:24 PM   Modules accepted: Orders

## 2018-08-07 ENCOUNTER — Other Ambulatory Visit: Payer: Self-pay | Admitting: Primary Care

## 2018-08-07 DIAGNOSIS — E785 Hyperlipidemia, unspecified: Secondary | ICD-10-CM

## 2018-08-11 ENCOUNTER — Other Ambulatory Visit (INDEPENDENT_AMBULATORY_CARE_PROVIDER_SITE_OTHER)

## 2018-08-11 DIAGNOSIS — E785 Hyperlipidemia, unspecified: Secondary | ICD-10-CM

## 2018-08-12 LAB — LIPID PANEL
Cholesterol: 210 mg/dL — ABNORMAL HIGH (ref 0–200)
HDL: 44 mg/dL (ref >39.00)
LDL Cholesterol: 131 mg/dL — ABNORMAL HIGH (ref 0–99)
NonHDL: 166.28
Total CHOL/HDL Ratio: 5
Triglycerides: 174 mg/dL — ABNORMAL HIGH (ref 0.0–149.0)
VLDL: 34.8 mg/dL (ref 0.0–40.0)

## 2019-03-07 IMAGING — CT CT HEAD W/O CM
3 series · 15 of 47 positions shown, 18 images · non-contrast
Comparison: None.

CLINICAL DATA: Recent short-term memory loss

EXAM:
CT HEAD WITHOUT CONTRAST
TECHNIQUE: Contiguous axial images were obtained from the base of the skull
through the vertex without intravenous contrast.

[Series 2: head 5.0 h37s · axial · 0.41mm/px · z∈[+124,+249]mm · 9 of 30 slices shown, 12 images]
[im 3/30  brain]
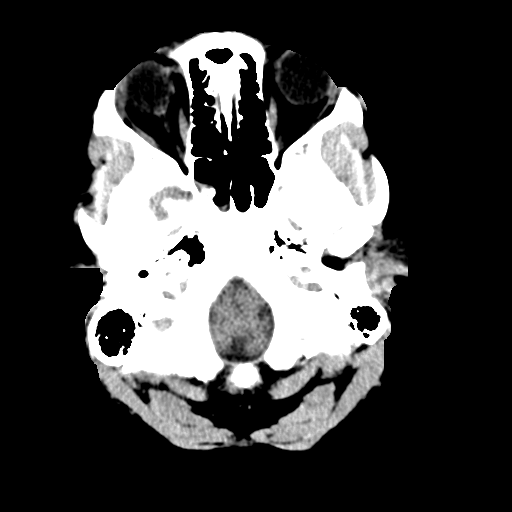
[im 3/30  bone]
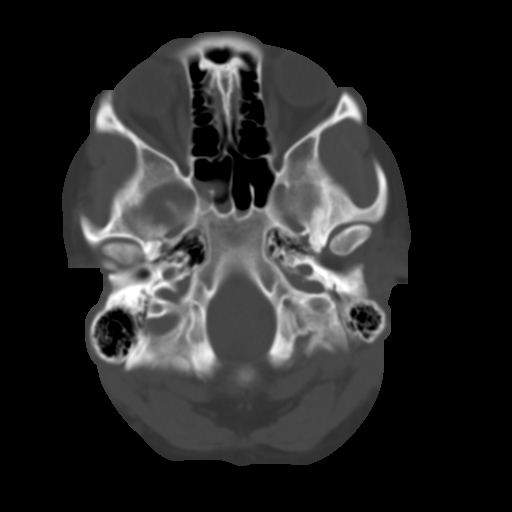
[im 6/30  brain]
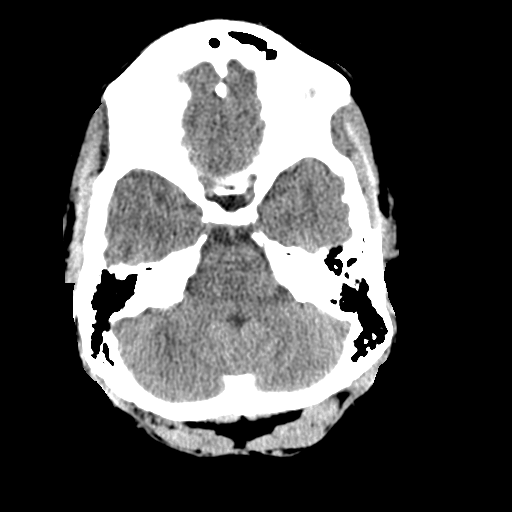
[im 9/30  brain]
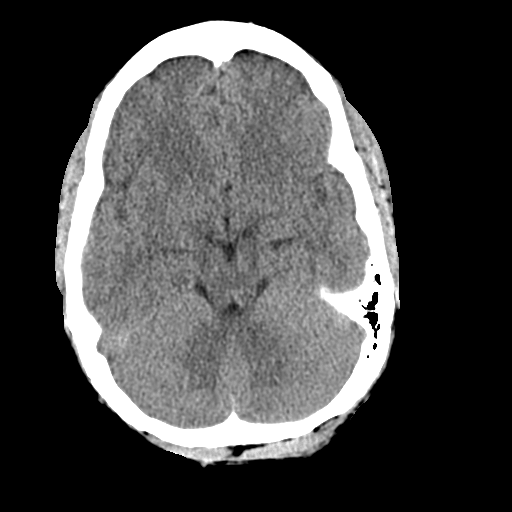
[im 12/30  brain]
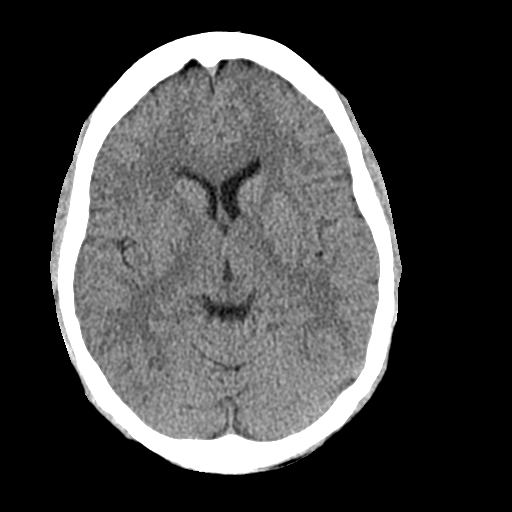
[im 16/30  brain]
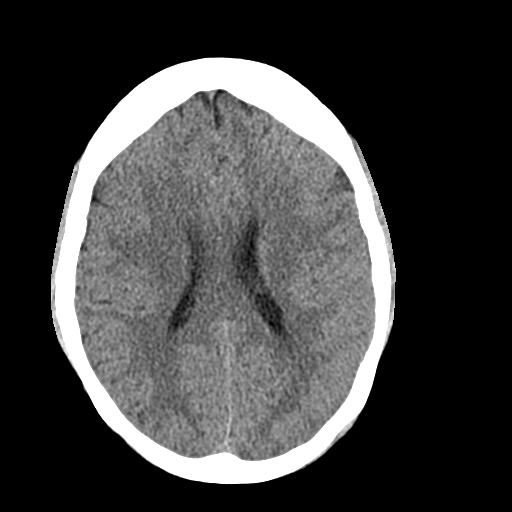
[im 16/30  bone]
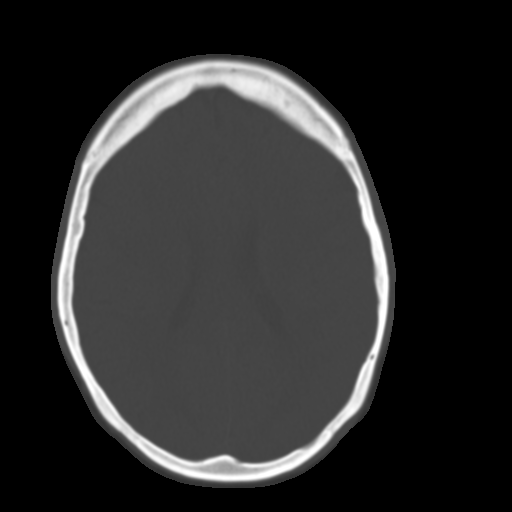
[im 19/30  brain]
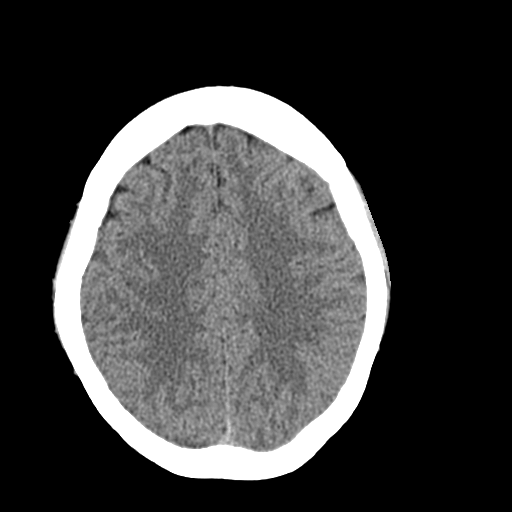
[im 22/30  brain]
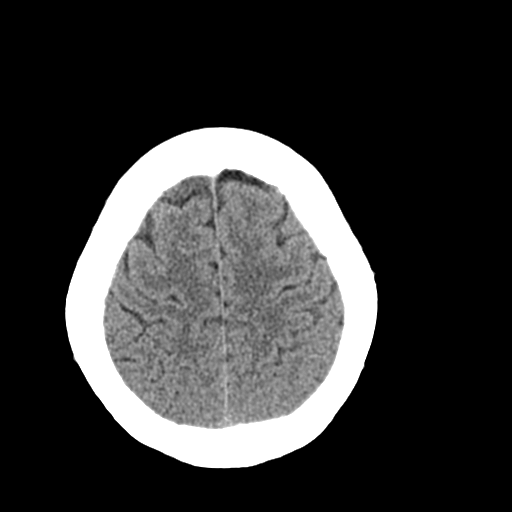
[im 25/30  brain]
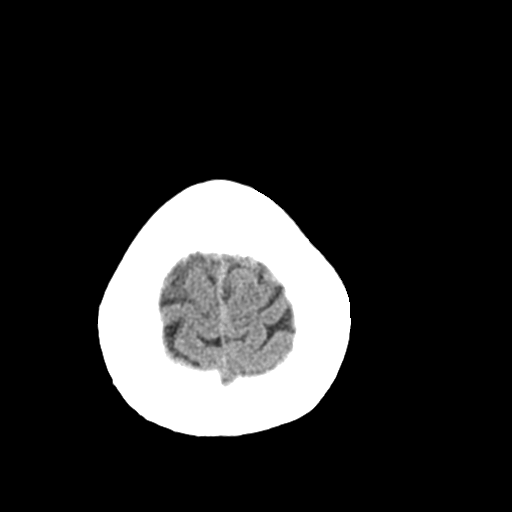
[im 28/30  brain]
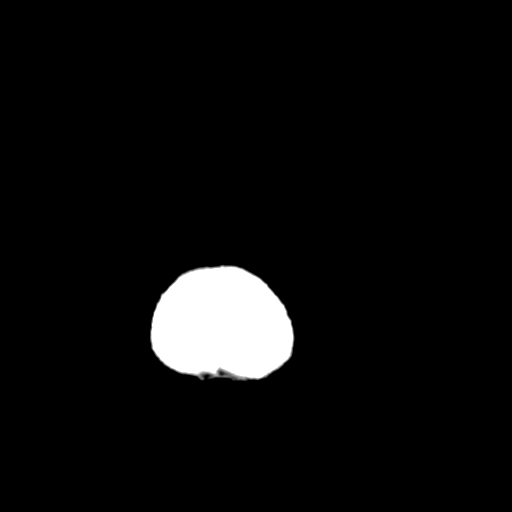
[im 28/30  bone]
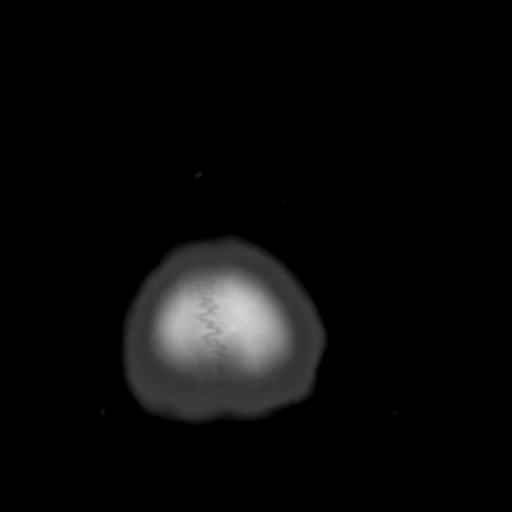

[Series 4: head 3.0 mpr cor · coronal · 0.29mm/px · 3 of 70 slices shown]
[im 24/70  brain]
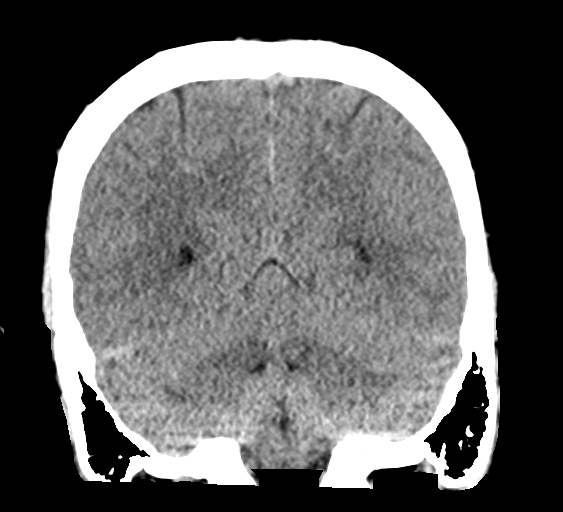
[im 31/70  brain]
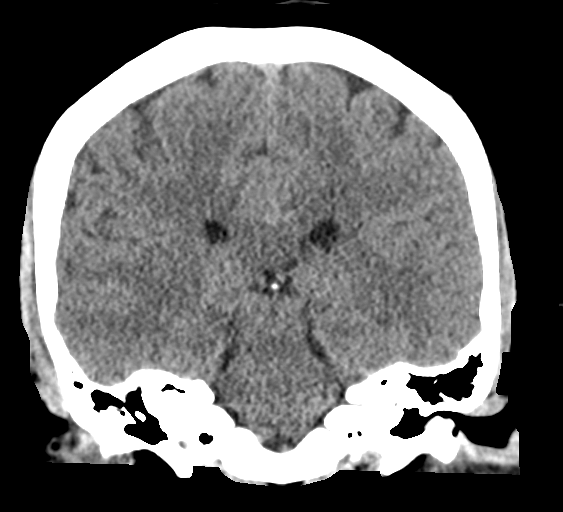
[im 39/70  brain]
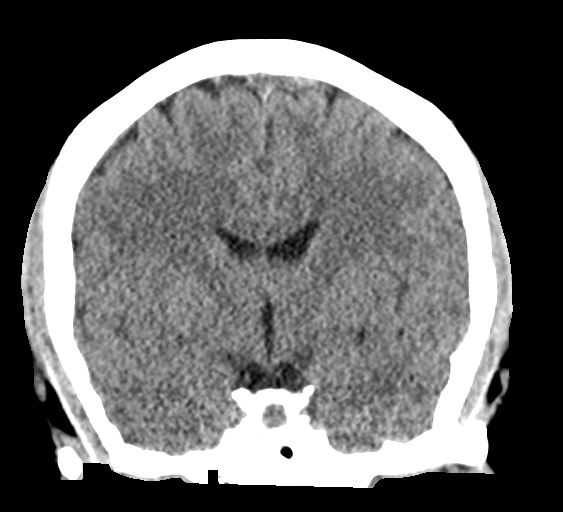

[Series 5: head 3.0 mpr sag · sagittal · 0.29mm/px · 3 of 56 slices shown]
[im 19/56  brain]
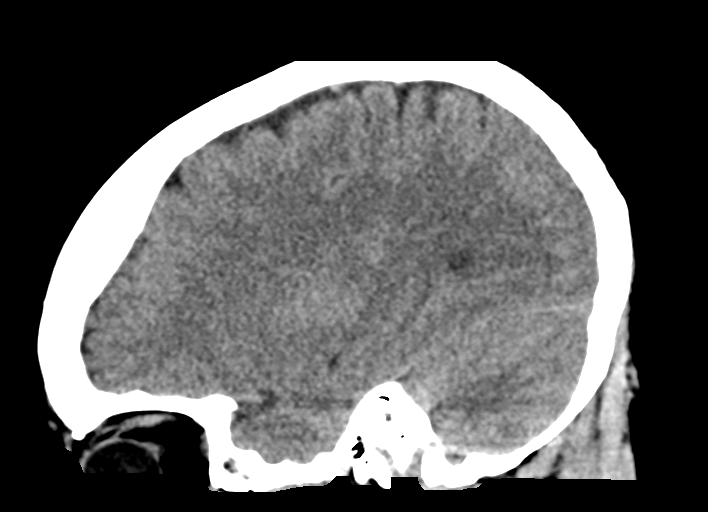
[im 28/56  brain]
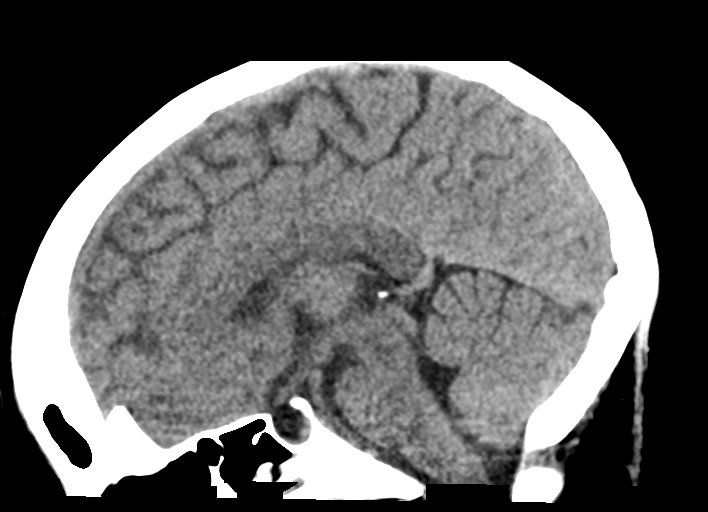
[im 37/56  brain]
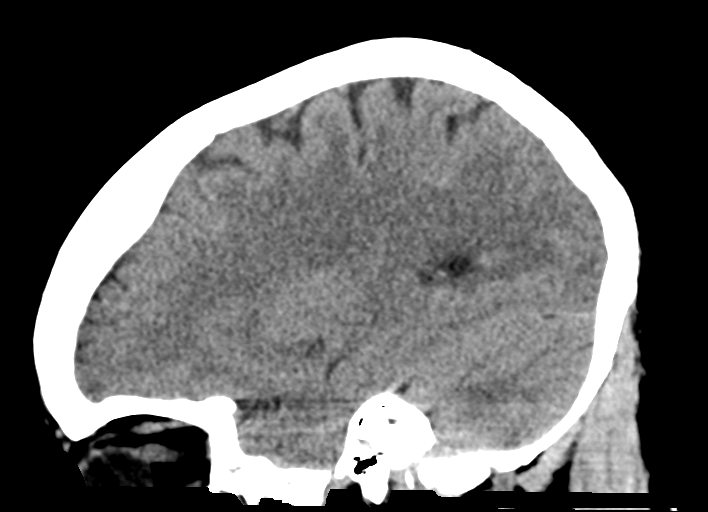

[15 of 47 positions shown; findings below may reference images not displayed]

FINDINGS: Brain: The ventricles are normal in size and configuration. There is
mild invagination of CSF into the sella. Left lateral ventricle is
slightly larger than the right lateral ventricle, a suspected
anatomic variant. There is no intracranial mass, hemorrhage,
extra-axial fluid collection, or midline shift. The brain parenchyma
appears unremarkable. No evident acute infarct.

Vascular: No hyperdense vessel. There is no appreciable vascular
calcification.

Skull: The bony calvarium appears intact.

Sinuses/Orbits: There is opacification in portions of the sphenoid
sinuses, considerably more on the right than on the left. There is
mild mucosal thickening in several ethmoid air cells. Visualized
orbits appear symmetric bilaterally.

Other: Visualized mastoid air cells are clear. There is debris in
each external auditory canal.
IMPRESSION: No intracranial mass or hemorrhage. Brain parenchyma appears normal.
Slight invagination of CSF into the sella is a finding of doubtful
clinical significance.

Areas of paranasal sinus disease, most marked in the right sphenoid
sinus. Probable cerumen in each external auditory canal.

## 2019-04-16 LAB — HM PAP SMEAR: HM Pap smear: NEGATIVE

## 2019-09-23 ENCOUNTER — Telehealth (INDEPENDENT_AMBULATORY_CARE_PROVIDER_SITE_OTHER): Admitting: Family Medicine

## 2019-09-23 ENCOUNTER — Other Ambulatory Visit: Payer: Self-pay

## 2019-09-23 ENCOUNTER — Encounter: Payer: Self-pay | Admitting: Family Medicine

## 2019-09-23 DIAGNOSIS — R062 Wheezing: Secondary | ICD-10-CM | POA: Diagnosis not present

## 2019-09-23 DIAGNOSIS — R059 Cough, unspecified: Secondary | ICD-10-CM

## 2019-09-23 DIAGNOSIS — R221 Localized swelling, mass and lump, neck: Secondary | ICD-10-CM

## 2019-09-23 DIAGNOSIS — Z8639 Personal history of other endocrine, nutritional and metabolic disease: Secondary | ICD-10-CM | POA: Diagnosis not present

## 2019-09-23 DIAGNOSIS — R05 Cough: Secondary | ICD-10-CM

## 2019-09-23 MED ORDER — ALBUTEROL SULFATE HFA 108 (90 BASE) MCG/ACT IN AERS: 2.0000 | INHALATION_SPRAY | RESPIRATORY_TRACT | 0 refills | Status: DC | PRN

## 2019-09-23 NOTE — Progress Notes (Signed)
Virtual Visit via Video Note  I connected with [name] on 09/23/19 at  9:15 AM EDT by a video enabled telemedicine application and verified that I am speaking with the correct person using two identifiers.  Location: Patient: In her home Provider: [facility] Persons participating in virtual visit: Patient and provider   I discussed the limitations of evaluation and management by telemedicine and the availability of in person appointments. The patient expressed understanding and agreed to proceed.  History of Present Illness: Chief Complaint  Patient presents with  . Cough    productive white sputum x 2 weeks... worse at night.... feeling of a lump in her throat, moves around when she coughs... Hx of thyroid nodules... Neg covid 09/15/19... pt has tried OTC cough syrup with some relief....  This is a 34 year old female who presents today for virtual visit for above chief complaints.  She had onset of cough approximately 2 weeks ago.  She has had white phlegm without increase or color change.  She did have a negative Covid test on 09/15/2019.  She denies nasal drainage, nasal congestion, postnasal drainage, fever, chills, shortness of breath or body aches.  She has noticed some wheezing with exercise as well as prior to cough spasms starting.  She has a history of exercise-induced asthma when she was in high school.  She has a history of a thyroid nodule and biopsy.  She feels some fullness in the right side of her neck as well as a sensation of shifting in her neck when she coughs.  She does not have any problems swallowing.  Last thyroid ultrasound 2019. She has been taking some over-the-counter dextromethorphan cough syrup with improvement of cough.    Observations/Objective: Patient is alert and answers questions appropriately.  Visible skin is unremarkable.  She is normally conversive without increased work of breathing.  No audible wheeze or witnessed cough.  Mood and affect are  appropriate.  There were no vitals taken for this visit. Wt Readings from Last 3 Encounters:  02/12/18 211 lb (95.7 kg)  02/07/18 216 lb (98 kg)  11/06/17 218 lb 6.4 oz (99.1 kg)   BP Readings from Last 3 Encounters:  02/12/18 132/88  02/07/18 120/76  11/06/17 114/70     Assessment and Plan: 1. History of thyroid nodule - US THYROID; Future  2. Fullness of neck - US THYROID; Future  3. Cough -Unclear etiology, does not sound like bacterial infection.  Discussed symptomatic treatment, using albuterol inhaler as needed, follow-up precautions - albuterol (VENTOLIN HFA) 108 (90 Base) MCG/ACT inhaler; Inhale 2 puffs into the lungs every 4 (four) hours as needed for wheezing or shortness of breath (cough, shortness of breath or wheezing.).  Dispense: 1 each; Refill: 0  4. Wheeze - albuterol (VENTOLIN HFA) 108 (90 Base) MCG/ACT inhaler; Inhale 2 puffs into the lungs every 4 (four) hours as needed for wheezing or shortness of breath (cough, shortness of breath or wheezing.).  Dispense: 1 each; Refill: 0   [name], [name]  [facility], [facility]  09/23/2019 12:54 PM   Follow Up Instructions:    I discussed the assessment and treatment plan with the patient. The patient was provided an opportunity to ask questions and all were answered. The patient agreed with the plan and demonstrated an understanding of the instructions.   The patient was advised to call back or seek an in-person evaluation if the symptoms worsen or if the condition fails to  improve as anticipated.   Emi Belfast, FNP

## 2019-10-01 ENCOUNTER — Ambulatory Visit
Admission: RE | Admit: 2019-10-01 | Discharge: 2019-10-01 | Disposition: A | Discharge status: Home / Self Care | Source: Ambulatory Visit | Attending: Family Medicine | Admitting: Family Medicine

## 2019-10-01 ENCOUNTER — Other Ambulatory Visit: Payer: Self-pay

## 2019-10-01 DIAGNOSIS — Z8639 Personal history of other endocrine, nutritional and metabolic disease: Secondary | ICD-10-CM | POA: Diagnosis not present

## 2019-10-01 DIAGNOSIS — R221 Localized swelling, mass and lump, neck: Secondary | ICD-10-CM | POA: Diagnosis present

## 2019-12-18 ENCOUNTER — Other Ambulatory Visit: Payer: Self-pay

## 2019-12-18 ENCOUNTER — Encounter (HOSPITAL_COMMUNITY): Payer: Self-pay

## 2019-12-18 ENCOUNTER — Ambulatory Visit (HOSPITAL_COMMUNITY)
Admission: EM | Admit: 2019-12-18 | Discharge: 2019-12-18 | Disposition: A | Discharge status: Home / Self Care | Attending: Physician Assistant | Admitting: Physician Assistant

## 2019-12-18 DIAGNOSIS — J45990 Exercise induced bronchospasm: Secondary | ICD-10-CM | POA: Insufficient documentation

## 2019-12-18 DIAGNOSIS — Z79899 Other long term (current) drug therapy: Secondary | ICD-10-CM | POA: Diagnosis not present

## 2019-12-18 DIAGNOSIS — J029 Acute pharyngitis, unspecified: Secondary | ICD-10-CM

## 2019-12-18 DIAGNOSIS — Z20822 Contact with and (suspected) exposure to covid-19: Secondary | ICD-10-CM | POA: Insufficient documentation

## 2019-12-18 LAB — SARS CORONAVIRUS 2 (TAT 6-24 HRS): SARS Coronavirus 2: NEGATIVE

## 2019-12-18 LAB — POCT RAPID STREP A, ED / UC: Streptococcus, Group A Screen (Direct): NEGATIVE

## 2019-12-18 NOTE — ED Triage Notes (Signed)
Pt in with c/o ST and swollen glands that started Tuesday. Also c/o cough  Pt has not had medication for sxs.  Denies fever, runny nose, sob

## 2019-12-18 NOTE — ED Provider Notes (Signed)
[facility]    CSN: [id number] Arrival date & time: 12/18/19  7517      History   Chief Complaint Chief Complaint  Patient presents with   Sore Throat    HPI [name] is a 34 y.o. female.   The history is provided by the patient. No language interpreter was used.  Sore Throat This is a new problem. The problem occurs constantly. The problem has not changed since onset.Nothing aggravates the symptoms. Nothing relieves the symptoms. She has tried nothing for the symptoms. The treatment provided moderate relief.  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   Depression    Exercise-induced asthma    GERD (gastroesophageal reflux disease)    Gestational diabetes    glyburide   Gestational diabetes mellitus 08/08/2016   History of asthma 10/06/2016   History of pre-eclampsia 06/12/2016   Hx of pre-eclampsia in prior pregnancy, currently pregnant    Obesity    Palpitations    normal cardiologist visit    Patient Active Problem List   Diagnosis Date Noted   Chronic low back pain 02/07/2018   History of CT scan of brain 02/07/2018   Encounter for annual general medical examination with abnormal findings in adult 02/07/2018   Hyperlipidemia 02/07/2018   Obesity 06/12/2016   GERD (gastroesophageal reflux disease) 04/12/2016   Mild exercise-induced asthma 10/04/2014    Past Surgical History:  Procedure Laterality Date   BREAST LUMPECTOMY     CESAREAN SECTION N/A 10/04/2014   Procedure: CESAREAN SECTION;  Surgeon: Jaymes Graff, MD;  Location: [facility];  Service: Obstetrics;  Laterality: N/A;   CESAREAN SECTION N/A 10/07/2016   Procedure: CESAREAN SECTION;  Surgeon: Geryl Rankins, MD;  Location: [facility];  Service: Obstetrics;  Laterality: N/A;    OB History    Gravida  2   Para  2   Term  2   Preterm      AB      Living  2     SAB        TAB      Ectopic      Multiple  0   Live Births  2            Home Medications    Prior to Admission medications   Medication Sig Start Date End Date Taking? Authorizing Provider  albuterol (VENTOLIN HFA) 108 (90 Base) MCG/ACT inhaler Inhale 2 puffs into the lungs every 4 (four) hours as needed for wheezing or shortness of breath (cough, shortness of breath or wheezing.). 09/23/19   Emi Belfast, FNP  diphenhydrAMINE (BENADRYL) 25 MG tablet Take 50 mg by mouth every 6 (six) hours as needed for allergies.    [provider]  etonogestrel (NEXPLANON) 68 MG IMPL implant Nexplanon 68 mg subdermal implant  Inject 1 implant by subcutaneous route.    [provider]  hydrocortisone cream 1 % Apply 1 application topically 2 (two) times daily as needed for itching.     [provider]  ibuprofen (ADVIL,MOTRIN) 600 MG tablet Take 1 tablet (600 mg total) by mouth every 6 (six) hours. 10/10/16   Prothero, Henderson Newcomer, CNM  Prenatal Vit-Fe Fumarate-FA (PRENATAL MULTIVITAMIN) TABS tablet Take 1 tablet by mouth daily at 12 noon.    [provider]    Family History Family History  Problem Relation Age of Onset   Hypertension  Mother    Breast cancer Paternal Aunt    Healthy Father    Rheum arthritis Sister    Endometriosis Sister    Anxiety disorder Sister    Miscarriages / [place] Sister    Post-traumatic stress disorder Sister    Other Sister        BENIGN BRAIN TUMOR   Diabetes Paternal Aunt     Social History Social History   Tobacco Use   Smoking status: Never Smoker   Smokeless tobacco: Never Used  Substance Use Topics   Alcohol use: Yes    Comment: occas. when not preg   Drug use: No     Allergies   Banana   Review of Systems Review of Systems  All other systems reviewed and are negative.    Physical Exam Triage Vital Signs ED Triage Vitals  Enc Vitals Group     BP 12/18/19 1029 132/82     Pulse  Rate 12/18/19 1029 60     Resp 12/18/19 1029 19     Temp 12/18/19 1029 98.3 F (36.8 C)     Temp Source 12/18/19 1029 Oral     SpO2 12/18/19 1029 100 %     Weight --      Height --      Head Circumference --      Peak Flow --      Pain Score 12/18/19 1027 5     Pain Loc --      Pain Edu? --      Excl. in GC? --    No data found.  Updated Vital Signs BP 132/82 (BP Location: Right Arm)    Pulse 60    Temp 98.3 F (36.8 C) (Oral)    Resp 19    LMP 12/15/2019 (Approximate)    SpO2 100%    Breastfeeding No   Visual Acuity Right Eye Distance:   Left Eye Distance:   Bilateral Distance:    Right Eye Near:   Left Eye Near:    Bilateral Near:     Physical Exam Vitals and nursing note reviewed.  Constitutional:      Appearance: She is well-developed.  HENT:     Head: Normocephalic.     Right Ear: Tympanic membrane normal.     Left Ear: Tympanic membrane normal.     Mouth/Throat:     Mouth: Mucous membranes are moist.     Pharynx: Posterior oropharyngeal erythema present.  Pulmonary:     Effort: Pulmonary effort is normal.  Abdominal:     General: There is no distension.  Musculoskeletal:        General: Normal range of motion.     Cervical back: Normal range of motion.  Neurological:     Mental Status: She is alert and oriented to person, place, and time.  Psychiatric:        Mood and Affect: Mood normal.      UC Treatments / Results  Labs (all labs ordered are listed, but only abnormal results are displayed) Labs Reviewed  SARS CORONAVIRUS 2 (TAT 6-24 HRS)  CULTURE, GROUP A STREP [facility])  POCT RAPID STREP A, ED / UC    EKG   Radiology No results found.  Procedures Procedures (including critical care time)  Medications Ordered in UC Medications - No data to display  Initial Impression / Assessment and Plan / UC Course  I have reviewed the triage vital signs and the nursing notes.  Pertinent labs & imaging results  that were available during my care  of the patient were reviewed by me and considered in my medical decision making (see chart for details).     MDM:  Strep is negative covid pending.  Pt advised tylenol  Final Clinical Impressions(s) / UC Diagnoses   Final diagnoses:  Pharyngitis, unspecified etiology     Discharge Instructions     Your covid test is pending.  Tylenol every 4 hours    ED Prescriptions    None     PDMP not reviewed this encounter.  An After Visit Summary was printed and given to the patient. '   Aydon Swamy K, Cordelia Poche 12/18/19 2045

## 2019-12-18 NOTE — Discharge Instructions (Addendum)
Your covid test is pending.  Tylenol every 4 hours

## 2019-12-20 LAB — CULTURE, GROUP A STREP (THRC)

## 2019-12-22 ENCOUNTER — Telehealth (HOSPITAL_COMMUNITY): Payer: Self-pay | Admitting: Emergency Medicine

## 2019-12-22 MED ORDER — PENICILLIN V POTASSIUM 500 MG PO TABS: 500.0000 mg | ORAL_TABLET | Freq: Two times a day (BID) | ORAL | 0 refills | Status: AC

## 2020-02-06 ENCOUNTER — Encounter: Payer: Self-pay | Admitting: Primary Care

## 2020-05-05 ENCOUNTER — Encounter: Payer: Self-pay | Admitting: Primary Care

## 2020-05-10 ENCOUNTER — Other Ambulatory Visit: Payer: Self-pay | Admitting: Obstetrics and Gynecology

## 2020-09-25 ENCOUNTER — Other Ambulatory Visit: Payer: Self-pay

## 2020-09-25 ENCOUNTER — Ambulatory Visit
Admission: RE | Admit: 2020-09-25 | Discharge: 2020-09-25 | Disposition: A | Discharge status: Home / Self Care | Source: Ambulatory Visit | Attending: Primary Care | Admitting: Primary Care

## 2020-09-25 VITALS — BP 126/82 | HR 72 | Temp 97.9°F | Resp 22

## 2020-09-25 DIAGNOSIS — M542 Cervicalgia: Secondary | ICD-10-CM

## 2020-09-25 NOTE — ED Provider Notes (Signed)
[facility]    CSN: [id number] Arrival date & time: 09/25/20  1021      History   Chief Complaint Chief Complaint  Patient presents with   Sore Throat    HPI [name] is a 35 y.o. female.  Patient presents with discomfort around her thyroid x1 week.  She states she has had this discomfort in the past and has been diagnosed with thyroid nodules and swelling.  She denies fever, chills, sore throat, difficulty swallowing, difficulty breathing, or other symptoms.  Her medical history also includes asthma, GERD, chronic low back pain, obesity.  The history is provided by the patient and medical records.   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   Depression    Exercise-induced asthma    GERD (gastroesophageal reflux disease)    Gestational diabetes    glyburide   Gestational diabetes mellitus 08/08/2016   History of asthma 10/06/2016   History of pre-eclampsia 06/12/2016   Hx of pre-eclampsia in prior pregnancy, currently pregnant    Obesity    Palpitations    normal cardiologist visit    Patient Active Problem List   Diagnosis Date Noted   Chronic low back pain 02/07/2018   History of CT scan of brain 02/07/2018   Encounter for annual general medical examination with abnormal findings in adult 02/07/2018   Hyperlipidemia 02/07/2018   Obesity 06/12/2016   GERD (gastroesophageal reflux disease) 04/12/2016   Mild exercise-induced asthma 10/04/2014    Past Surgical History:  Procedure Laterality Date   BREAST LUMPECTOMY     CESAREAN SECTION N/A 10/04/2014   Procedure: CESAREAN SECTION;  Surgeon: Jaymes Graff, MD;  Location: [facility];  Service: Obstetrics;  Laterality: N/A;   CESAREAN SECTION N/A 10/07/2016   Procedure: CESAREAN SECTION;  Surgeon: Geryl Rankins, MD;  Location: [facility];  Service: Obstetrics;  Laterality: N/A;   WISDOM TOOTH EXTRACTION  2010    OB History     Gravida  2   Para  2    Term  2   Preterm      AB      Living  2      SAB      IAB      Ectopic      Multiple  0   Live Births  2            Home Medications    Prior to Admission medications   Medication Sig Start Date End Date Taking? Authorizing Provider  albuterol (VENTOLIN HFA) 108 (90 Base) MCG/ACT inhaler Inhale 2 puffs into the lungs every 4 (four) hours as needed for wheezing or shortness of breath (cough, shortness of breath or wheezing.). 09/23/19   Emi Belfast, FNP  diphenhydrAMINE (BENADRYL) 25 MG tablet Take 50 mg by mouth every 6 (six) hours as needed for allergies.    [provider]  etonogestrel (NEXPLANON) 68 MG IMPL implant Nexplanon 68 mg subdermal implant  Inject 1 implant by subcutaneous route.    [provider]  hydrocortisone cream 1 % Apply 1 application topically 2 (two) times daily as needed for itching.     [provider]  ibuprofen (ADVIL,MOTRIN) 600 MG tablet Take 1 tablet (600 mg total) by mouth every 6 (six) hours. 10/10/16   Prothero, Henderson Newcomer, CNM  Prenatal Vit-Fe Fumarate-FA (PRENATAL MULTIVITAMIN) TABS tablet Take 1 tablet by mouth daily at 12 noon.    [provider]  Family History Family History  Problem Relation Age of Onset   Hypertension Mother    Breast cancer Paternal Aunt    Diabetes Mellitus II Paternal Aunt    Healthy Father    Rheum arthritis Sister    Endometriosis Sister    Anxiety disorder Sister    Miscarriages / Stillbirths Sister    Post-traumatic stress disorder Sister    Other Sister        BENIGN BRAIN TUMOR   Diabetes Paternal Aunt    Hypertension Maternal Grandmother    Dementia Maternal Grandmother    Hypertension Paternal Grandmother    Aneurysm Paternal Grandmother    Diabetes Mellitus II Paternal Grandmother     Social History Social History   Tobacco Use   Smoking status: Never   Smokeless tobacco: Never  Substance Use Topics   Alcohol use: Yes    Comment:  occas. when not preg   Drug use: No     Allergies   Banana   Review of Systems Review of Systems  Constitutional:  Negative for chills and fever.  HENT:  Negative for ear pain, sore throat, trouble swallowing and voice change.   Respiratory:  Negative for cough and shortness of breath.   Cardiovascular:  Negative for chest pain and palpitations.  Gastrointestinal:  Negative for abdominal pain and vomiting.  Skin:  Negative for color change and rash.  All other systems reviewed and are negative.   Physical Exam Triage Vital Signs ED Triage Vitals  Enc Vitals Group     BP      Pulse      Resp      Temp      Temp src      SpO2      Weight      Height      Head Circumference      Peak Flow      Pain Score      Pain Loc      Pain Edu?      Excl. in GC?    No data found.  Updated Vital Signs BP 126/82   Pulse 72   Temp 97.9 F (36.6 C)   Resp (!) 22   SpO2 98%   Visual Acuity Right Eye Distance:   Left Eye Distance:   Bilateral Distance:    Right Eye Near:   Left Eye Near:    Bilateral Near:     Physical Exam Vitals and nursing note reviewed.  Constitutional:      General: She is not in acute distress.    Appearance: She is well-developed. She is obese. She is not ill-appearing.  HENT:     Head: Normocephalic and atraumatic.     Mouth/Throat:     Mouth: Mucous membranes are moist.     Pharynx: Oropharynx is clear.     Comments: Speech clear.  No difficulty swallowing.  No oropharyngeal swelling. Eyes:     Conjunctiva/sclera: Conjunctivae normal.  Cardiovascular:     Rate and Rhythm: Normal rate and regular rhythm.     Heart sounds: Normal heart sounds.  Pulmonary:     Effort: Pulmonary effort is normal. No respiratory distress.     Breath sounds: Normal breath sounds.  Abdominal:     Palpations: Abdomen is soft.     Tenderness: There is no abdominal tenderness.  Musculoskeletal:     Cervical back: Neck supple. No rigidity.  Lymphadenopathy:      Cervical: No cervical adenopathy.  Skin:    General: Skin is warm and dry.  Neurological:     General: No focal deficit present.     Mental Status: She is alert and oriented to person, place, and time.     Gait: Gait normal.  Psychiatric:        Mood and Affect: Mood normal.        Behavior: Behavior normal.     UC Treatments / Results  Labs (all labs ordered are listed, but only abnormal results are displayed) Labs Reviewed - No data to display  EKG   Radiology No results found.  Procedures Procedures (including critical care time)  Medications Ordered in UC Medications - No data to display  Initial Impression / Assessment and Plan / UC Course  I have reviewed the triage vital signs and the nursing notes.  Pertinent labs & imaging results that were available during my care of the patient were reviewed by me and considered in my medical decision making (see chart for details).  Neck pain around thyroid.  No difficulty swallowing or respiratory distress.  Instructed patient to schedule an appointment with her PCP as soon as possible for evaluation of her thyroid.  Discussed that she should go to the emergency department if she has any difficulty swallowing or breathing.  Patient agrees to plan of care.   Final Clinical Impressions(s) / UC Diagnoses   Final diagnoses:  Neck pain     Discharge Instructions      Schedule an appointment with your primary care provider for evaluation of your thyroid.    Go to the emergency department if you have any difficulty swallowing or breathing.         ED Prescriptions   None    PDMP not reviewed this encounter.   Mickie Bail, NP 09/25/20 1123

## 2020-09-25 NOTE — ED Triage Notes (Signed)
Pt here with sore throat at the base of throat near thyroid. Pt states she does have thyroid nodules and chronic swelling of the thyroid, but has gotten it tested and imaged and was told it was ok for now. This week she has had painful swallowing and sore throat in that region when she normally does not. No other sx present.

## 2020-09-25 NOTE — Discharge Instructions (Addendum)
Schedule an appointment with your primary care provider for evaluation of your thyroid.    Go to the emergency department if you have any difficulty swallowing or breathing.

## 2020-09-26 ENCOUNTER — Ambulatory Visit (INDEPENDENT_AMBULATORY_CARE_PROVIDER_SITE_OTHER): Admitting: Family Medicine

## 2020-09-26 ENCOUNTER — Other Ambulatory Visit: Payer: Self-pay

## 2020-09-26 VITALS — BP 108/70 | HR 64 | Temp 98.1°F | Wt 200.8 lb

## 2020-09-26 DIAGNOSIS — E049 Nontoxic goiter, unspecified: Secondary | ICD-10-CM

## 2020-09-26 DIAGNOSIS — E669 Obesity, unspecified: Secondary | ICD-10-CM | POA: Diagnosis not present

## 2020-09-26 DIAGNOSIS — E785 Hyperlipidemia, unspecified: Secondary | ICD-10-CM | POA: Diagnosis not present

## 2020-09-26 NOTE — Assessment & Plan Note (Signed)
Due for repeat labs - will recheck

## 2020-09-26 NOTE — Progress Notes (Signed)
Subjective:     [name] is a 35 y.o. female presenting for Follow-up (UC visit yesterday for neck pain )     HPI  #Neck pain - anterior - central near the thyroid - starts on the right side and wraps around the thyroid  - when she works out will feel her thyroid pushing against the trachea - Friday night woke with a cough and the thyroid was sore to the touch - the next morning felt pressure on the trachea - is able to breath but feels a pressure - does feel the thyroid is larger in size  Did note recent 7 lb weight gain over a short period of time   Review of Systems  Constitutional:  Positive for unexpected weight change. Negative for chills and fatigue.  Cardiovascular:  Positive for palpitations.    Social History   Tobacco Use  Smoking Status Never  Smokeless Tobacco Never        Objective:    BP Readings from Last 3 Encounters:  09/26/20 108/70  09/25/20 126/82  12/18/19 132/82   Wt Readings from Last 3 Encounters:  09/26/20 200 lb 12 oz (91.1 kg)  02/12/18 211 lb (95.7 kg)  02/07/18 216 lb (98 kg)    BP 108/70   Pulse 64   Temp 98.1 F (36.7 C) (Temporal)   Wt 200 lb 12 oz (91.1 kg)   LMP 09/20/2020 (Exact Date)   SpO2 97%   BMI 32.40 kg/m    Physical Exam Constitutional:      General: She is not in acute distress.    Appearance: She is well-developed. She is not diaphoretic.  HENT:     Right Ear: External ear normal.     Left Ear: External ear normal.     Nose: Nose normal.     Mouth/Throat:     Mouth: Mucous membranes are moist.     Pharynx: No posterior oropharyngeal erythema.  Eyes:     Conjunctiva/sclera: Conjunctivae normal.  Neck:     Thyroid: Thyroid mass, thyromegaly and thyroid tenderness present.  Cardiovascular:     Rate and Rhythm: Normal rate and regular rhythm.     Heart sounds: No murmur heard. Pulmonary:     Effort: Pulmonary effort is normal. No respiratory distress.     Breath sounds: Normal breath  sounds. No wheezing.  Musculoskeletal:     Cervical back: Normal range of motion and neck supple.  Skin:    General: Skin is warm and dry.     Capillary Refill: Capillary refill takes less than 2 seconds.  Neurological:     Mental Status: She is alert. Mental status is at baseline.  Psychiatric:        Mood and Affect: Mood normal.        Behavior: Behavior normal.          Assessment & Plan:   Problem List Items Addressed This Visit       Endocrine   Enlarged thyroid - Primary    Reviewed most recent US which showed stable thyroid and nodules no need for repeat. Though now pt with local symptoms and pain on thyroid. Last TSH was normal but given enlargement cannot find previous TPO antibodies. Will get TSH and TPO and additional studies if needed. Repeat US. Discussed if everything normal - could still refer for surgery if pt interested. She will consider.       Relevant Orders   US THYROID   TSH  Thyroid Peroxidase Antibodies (TPO) (REFL)     Other   Obesity   Relevant Orders   Comprehensive metabolic panel   Hyperlipidemia    Due for repeat labs - will recheck      Relevant Orders   Lipid panel     I spent >15 minutes with pt , obtaining history, examining, reviewing chart, documenting encounter and discussing the above plan of care.   Return if symptoms worsen or fail to improve.  Lynnda Child, MD  This visit occurred during the SARS-CoV-2 public health emergency.  Safety protocols were in place, including screening questions prior to the visit, additional usage of staff PPE, and extensive cleaning of exam room while observing appropriate contact time as indicated for disinfecting solutions.

## 2020-09-26 NOTE — Assessment & Plan Note (Signed)
Reviewed most recent US which showed stable thyroid and nodules no need for repeat. Though now pt with local symptoms and pain on thyroid. Last TSH was normal but given enlargement cannot find previous TPO antibodies. Will get TSH and TPO and additional studies if needed. Repeat US. Discussed if everything normal - could still refer for surgery if pt interested. She will consider.

## 2020-09-26 NOTE — Patient Instructions (Signed)
Let [name] know if you want to see a surgeon even if normal or no change   ER - if you cannot breath and symptoms becoming severe  Get ultrasound and labs

## 2020-09-27 LAB — COMPREHENSIVE METABOLIC PANEL
ALT: 10 U/L (ref 0–35)
AST: 12 U/L (ref 0–37)
Albumin: 4.2 g/dL (ref 3.5–5.2)
Alkaline Phosphatase: 50 U/L (ref 39–117)
BUN: 14 mg/dL (ref 6–23)
CO2: 28 mEq/L (ref 19–32)
Calcium: 9.9 mg/dL (ref 8.4–10.5)
Chloride: 104 mEq/L (ref 96–112)
Creatinine, Ser: 0.89 mg/dL (ref 0.40–1.20)
GFR: 83.69 mL/min (ref >60.00)
Glucose, Bld: 74 mg/dL (ref 70–99)
Potassium: 4.3 mEq/L (ref 3.5–5.1)
Sodium: 139 mEq/L (ref 135–145)
Total Bilirubin: 0.4 mg/dL (ref 0.2–1.2)
Total Protein: 7.3 g/dL (ref 6.0–8.3)

## 2020-09-27 LAB — THYROID PEROXIDASE ANTIBODIES (TPO) (REFL): Thyroperoxidase Ab SerPl-aCnc: <1 IU/mL (ref <9)

## 2020-09-27 LAB — LIPID PANEL
Cholesterol: 209 mg/dL — ABNORMAL HIGH (ref 0–200)
HDL: 54.2 mg/dL (ref >39.00)
LDL Cholesterol: 137 mg/dL — ABNORMAL HIGH (ref 0–99)
NonHDL: 155.25
Total CHOL/HDL Ratio: 4
Triglycerides: 92 mg/dL (ref 0.0–149.0)
VLDL: 18.4 mg/dL (ref 0.0–40.0)

## 2020-09-27 LAB — TSH: TSH: 2.08 u[IU]/mL (ref 0.35–5.50)

## 2020-09-29 ENCOUNTER — Ambulatory Visit
Admission: RE | Admit: 2020-09-29 | Discharge: 2020-09-29 | Disposition: A | Discharge status: Home / Self Care | Source: Ambulatory Visit | Attending: Family Medicine | Admitting: Family Medicine

## 2020-09-29 DIAGNOSIS — E049 Nontoxic goiter, unspecified: Secondary | ICD-10-CM

## 2021-08-21 ENCOUNTER — Telehealth: Payer: Self-pay | Admitting: Primary Care

## 2021-08-21 ENCOUNTER — Encounter: Payer: Self-pay | Admitting: Family Medicine

## 2021-08-21 ENCOUNTER — Ambulatory Visit (INDEPENDENT_AMBULATORY_CARE_PROVIDER_SITE_OTHER): Admitting: Family Medicine

## 2021-08-21 VITALS — BP 118/78 | HR 90 | Temp 97.9°F | Ht 66.0 in | Wt 207.4 lb

## 2021-08-21 DIAGNOSIS — J209 Acute bronchitis, unspecified: Secondary | ICD-10-CM | POA: Insufficient documentation

## 2021-08-21 DIAGNOSIS — E049 Nontoxic goiter, unspecified: Secondary | ICD-10-CM

## 2021-08-21 MED ORDER — PREDNISONE 10 MG PO TABS: ORAL_TABLET | ORAL | 0 refills | Status: DC

## 2021-08-21 MED ORDER — AMOXICILLIN-POT CLAVULANATE 875-125 MG PO TABS: 1.0000 | ORAL_TABLET | Freq: Two times a day (BID) | ORAL | 0 refills | Status: DC

## 2021-08-21 NOTE — Assessment & Plan Note (Signed)
She is more symptomatic with thyroid now-causing neck pressure and more hoarseness  Will discuss endocrinology ref with pcp

## 2021-08-21 NOTE — Telephone Encounter (Signed)
Patient called and stated that she may have a sinus infection. She has a lump in her throat pushing against her airways. Pain level from 3 to 4 on pain scale. Symptoms started Thursday night. Fri/Sat took Covid test and came back negative. Not able to take a full breath, Patient was triaged.

## 2021-08-21 NOTE — Telephone Encounter (Signed)
[facility] Day - Client TELEPHONE ADVICE RECORD [facility] Patient Name: [name] Gender: Female DOB: 15-Dec-1985 Age: 36 Y 10 M 5 D Return Phone Number: [phone] (Primary), [phone] (Secondary) Address: City/ [place]/ [place]  [address] Client [facility] Day - Client Client Site [facility] - Day Provider Vernona Rieger - NP Contact Type Call Who Is Calling Patient / Member / Family / Caregiver Call Type Triage / Clinical Relationship To Patient Self Return Phone Number [phone] (Primary) Chief Complaint BREATHING - shortness of breath or sounds breathless Reason for Call Symptomatic / Request for Health Information Initial Comment Caller states she has pressure in her throat and feels short of breath, cough and congestion. Caller states she also has the feeling of a lump in her neck that is swollen. Translation No Nurse Assessment Nurse: Gasper Sells, RN, Marylu Lund Date/Time Lamount Cohen Time): 08/21/2021 12:01:53 PM Confirm and document reason for call. If symptomatic, describe symptoms. ---Caller states she has pressure in her throat and feels short of breath, cough and congestion. Caller states she also has the feeling of a lump in her neck that is swollen in front. Neg. home covid test x2. Started Thursday. Does the patient have any new or worsening symptoms? ---Yes Will a triage be completed? ---Yes Related visit to physician within the last 2 weeks? ---No Does the PT have any chronic conditions? (i.e. diabetes, asthma, this includes High risk factors for pregnancy, etc.) ---No Is the patient pregnant or possibly pregnant? (Ask all females between the ages of 8-55) ---No Is this a behavioral health or substance abuse call? ---No Guidelines Guideline Title Affirmed Question Affirmed Notes Nurse Date/Time (Eastern Time) COVID-19 - Diagnosed or Suspected [1]  COVID-19 infection suspected by caller or triager AND [2] mild symptoms (cough, fever, or others) Gasper Sells, RN, Marylu Lund 08/21/2021 12:03:25 PM PLEASE NOTE: All timestamps contained within this report are represented as [place] Standard Time. CONFIDENTIALTY NOTICE: This fax transmission is intended only for the addressee. It contains information that is legally privileged, confidential or otherwise protected from use or disclosure. If you are not the intended recipient, you are strictly prohibited from reviewing, disclosing, copying using or disseminating any of this information or taking any action in reliance on or regarding this information. If you have received this fax in error, please notify us immediately by telephone so that we can arrange for its return to [place]. Phone: [phone], Toll-Free: [phone], Fax: [phone] Page: 2 of 3 Call Id: [id number] Guidelines Guideline Title Affirmed Question Affirmed Notes Nurse Date/Time Lamount Cohen Time) AND [3] negative COVID-19 rapid test Disp. Time Lamount Cohen Time) Disposition Final User 08/21/2021 11:59:28 AM Send to Urgent Queue Emeline Gins 08/21/2021 12:08:13 PM Call PCP when Office is Open Yes Gasper Sells, RN, Marylu Lund Final Disposition 08/21/2021 12:08:13 PM Call PCP when Office is Open Yes Gasper Sells, RN, Herbert Pun Disagree/Comply Comply Caller Understands Yes PreDisposition Call Doctor Care Advice Given Per Guideline CALL PCP WHEN OFFICE IS OPEN: * You need to discuss this with your doctor (or NP/PA) within the next few days. * Call the office when it is open. GENERAL CARE ADVICE FOR COVID-19 SYMPTOMS: * The symptoms are generally treated the same whether you have COVID-19, influenza or some other respiratory virus. * Cough: Use cough drops. * Feeling dehydrated: Drink extra liquids. If the air in your home is dry, use a humidifier. * Fever, Chills, and Sweats: For fever over 101 F (38.3 C), take acetaminophen every 4 to  6 hours  (Adults 650 mg) OR ibuprofen every 6 to 8 hours (Adults 400 mg). Before taking any medicine, read all the instructions on the package. Do not take aspirin unless your doctor has prescribed it for you. Chills can sometimes come before a fever. You may feel cold in your hands and feet. You may have shivering. You may also feel sweaty as your body temperature goes down. * Muscle aches, headache, and other pains: Often this comes and goes with the fever. Take acetaminophen every 4 to 6 hours (Adults 650 mg) OR ibuprofen every 6 to 8 hours (Adults 400 mg). Before taking any medicine, read all the instructions on the package. * Sore throat: Try throat lozenges, hard candy or warm chicken broth. COUGH MEDICINES: * COUGH DROPS: Over-the-counter cough drops can help a lot, especially for mild coughs. They soothe an irritated throat and remove the tickle sensation in the back of the throat. Cough drops are easy to carry with you. HUMIDIFIER: * If the air is dry, use a humidifier in the bedroom. * Dry air makes coughs worse. COUGHING SPELLS: * Drink warm fluids. Inhale warm mist. This can help relax the airway and also loosen up phlegm. * Suck on cough drops or hard candy to coat the irritated throat. CALL BACK IF: * Fever over 103 F (39.4 C) * Fever lasts over 3 days * Fever returns after being gone for 24 hours * Chest pain or difficulty breathing occurs * You become worse CARE ADVICE given per COVID-19 - DIAGNOSED OR SUSPECTED (Adult) guideline. After Care Instructions Given Call Event Type User Date / Time Description Education document email Quentin Cornwall 08/21/2021 12:10:35 PM COVID-19 Diagnosed or Suspected Education document email Quentin Cornwall 08/21/2021 12:10:35 PM Sore Throat - Symptom Comments User: Jason Coop, RN Date/Time Lamount Cohen Time): 08/21/2021 12:09:12 PM PLEASE NOTE: All timestamps contained within this report are represented as [place] Standard Time. CONFIDENTIALTY  NOTICE: This fax transmission is intended only for the addressee. It contains information that is legally privileged, confidential or otherwise protected from use or disclosure. If you are not the intended recipient, you are strictly prohibited from reviewing, disclosing, copying using or disseminating any of this information or taking any action in reliance on or regarding this information. If you have received this fax in error, please notify us immediately by telephone so that we can arrange for its return to [place]. Phone: [phone], Toll-Free: [phone], Fax: [phone] Page: 3 of 3 Call Id: [id number] Comments Upgrading to be seen within 4 hours User: Jason Coop, RN Date/Time Lamount Cohen Time): 08/21/2021 12:11:49 PM # 4 IN CALL QUE FOR NURSE BACKLINE. Advised pt to tell nurse already spoke to RN and to be seen in 4 hours. [facility] Referrals REFERRED TO PCP OFFIC

## 2021-08-21 NOTE — Patient Instructions (Signed)
Take care of yourself  Drink fluids and rest  mucinex DM is good for cough and congestion  Nasal saline for congestion as needed  Tylenol for fever or pain or headache  Please alert [place] if symptoms worsen (if severe or short of breath please go to the ER)   Take augmentin for sinus infection  Take the prednisone for bronchitis  It will make you hyper and hungry   I will let Jae Dire know about the thyroid situation

## 2021-08-21 NOTE — Telephone Encounter (Signed)
Agree with ER parameters Will see her then

## 2021-08-21 NOTE — Telephone Encounter (Signed)
Per appt notes pt already has appt scheduled with Dr Milinda Antis 08/21/21 at 4pm. I spoke with pt and pt said she is not in any distress.pt has prod cough with yellow phlegm that started 08/17/21. Pt said unless has severe coughing episode pt is not SOB and pts throat is not causing any problems with swallowing or breathing. Pt has slight wheezing. If pt condition changes or worsens pt will go to ED. UC & ED precautions given and pt voiced understanding.

## 2021-08-21 NOTE — Progress Notes (Signed)
Subjective:    Patient ID: [name], female    DOB: 1985-04-14, 36 y.o.   MRN: [id number]  HPI 36 yo pt of NP Clark presents for cough   Wt Readings from Last 3 Encounters:  08/21/21 207 lb 6 oz (94.1 kg)  09/26/20 200 lb 12 oz (91.1 kg)  02/12/18 211 lb (95.7 kg)   33.47 kg/m  Thinks she is fighting a sinus infection  Was outdoors and noted itching in her face -took some benadryl   Started 7/20 with cough worse on Saturday  Stayed in all weekend  A lot of pnd  Productive cough with yellow phlegm / in ams is green  Feels like lump in throat (not causing her to have breathing or swallowing problems)  She noticed that in the past when exercising as well   Face was painful below eyes  Not tight in chest but does have a little wheeze at times   R ear feels full and congested  No ST   Thinks she had a temp to start / chills and a sweat this weekend   No nausea  Vomiting once from cough   Has done a covid test -twice and negative    She has h/o mild exercise induced asthma  Also enlarged thyroid -gets sore at times   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 [employer]  More hoarse even before this illness  Patient Active Problem List   Diagnosis Date Noted   Acute bronchitis 08/21/2021   Enlarged thyroid 09/26/2020   Chronic low back pain 02/07/2018   History of CT scan of brain 02/07/2018   Hyperlipidemia 02/07/2018   Obesity 06/12/2016   GERD (gastroesophageal reflux disease) 04/12/2016   Mild exercise-induced asthma 10/04/2014   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   Depression    Exercise-induced asthma    GERD (gastroesophageal reflux disease)    Gestational diabetes    glyburide   Gestational diabetes mellitus 08/08/2016   History of asthma 10/06/2016   History of pre-eclampsia 06/12/2016   Hx of pre-eclampsia in prior pregnancy, currently pregnant    Obesity    Palpitations    normal cardiologist visit   Past  Surgical History:  Procedure Laterality Date   BREAST LUMPECTOMY     CESAREAN SECTION N/A 10/04/2014   Procedure: CESAREAN SECTION;  Surgeon: Jaymes Graff, MD;  Location: [facility];  Service: Obstetrics;  Laterality: N/A;   CESAREAN SECTION N/A 10/07/2016   Procedure: CESAREAN SECTION;  Surgeon: Geryl Rankins, MD;  Location: [facility];  Service: Obstetrics;  Laterality: N/A;   WISDOM TOOTH EXTRACTION  2010   Social History   Tobacco Use   Smoking status: Never   Smokeless tobacco: Never  Substance Use Topics   Alcohol use: Yes    Comment: occas. when not preg   Drug use: No   Family History  Problem Relation Age of Onset   Hypertension Mother    Breast cancer Paternal Aunt    Diabetes Mellitus II Paternal Aunt    Healthy Father    Rheum arthritis Sister    Endometriosis Sister    Anxiety disorder Sister    Miscarriages / Stillbirths Sister    Post-traumatic stress disorder Sister    Other Sister        BENIGN BRAIN TUMOR   Diabetes Paternal Aunt    Hypertension Maternal Grandmother    Dementia Maternal Grandmother    Hypertension  Paternal Grandmother    Aneurysm Paternal Grandmother    Diabetes Mellitus II Paternal Grandmother    Allergies  Allergen Reactions   Banana Nausea And Vomiting    Patient also experiences severe abdominal pain   Current Outpatient Medications on File Prior to Visit  Medication Sig Dispense Refill   albuterol (VENTOLIN HFA) 108 (90 Base) MCG/ACT inhaler Inhale 2 puffs into the lungs every 4 (four) hours as needed for wheezing or shortness of breath (cough, shortness of breath or wheezing.). 1 each 0   diphenhydrAMINE (BENADRYL) 25 MG tablet Take 50 mg by mouth every 6 (six) hours as needed for allergies.     hydrocortisone cream 1 % Apply 1 application topically 2 (two) times daily as needed for itching.      ibuprofen (ADVIL,MOTRIN) 600 MG tablet Take 1 tablet (600 mg total) by mouth every 6 (six) hours. 30 tablet 0   No current  facility-administered medications on file prior to visit.     Review of Systems  Constitutional:  Negative for activity change, appetite change, fatigue, fever and unexpected weight change.  HENT:  Positive for postnasal drip, sneezing and voice change. Negative for congestion, ear pain, rhinorrhea, sinus pressure, sore throat and trouble swallowing.   Eyes:  Negative for pain, discharge, redness and visual disturbance.  Respiratory:  Positive for cough and wheezing. Negative for shortness of breath and stridor.   Cardiovascular:  Negative for chest pain and palpitations.  Gastrointestinal:  Negative for abdominal pain, blood in stool, constipation, diarrhea, nausea and vomiting.  Endocrine: Negative for polydipsia and polyuria.  Genitourinary:  Negative for dysuria, frequency, hematuria and urgency.  Musculoskeletal:  Negative for arthralgias, back pain and myalgias.  Skin:  Negative for pallor and rash.  Allergic/Immunologic: Negative for environmental allergies.  Neurological:  Negative for dizziness, syncope, weakness, light-headedness and headaches.  Hematological:  Negative for adenopathy. Does not bruise/bleed easily.  Psychiatric/Behavioral:  Negative for confusion, decreased concentration and dysphoric mood. The patient is not nervous/anxious.        Objective:   Physical Exam Constitutional:      General: She is not in acute distress.    Appearance: Normal appearance. She is well-developed. She is obese. She is not ill-appearing, toxic-appearing or diaphoretic.  HENT:     Head: Normocephalic and atraumatic.     Comments: Nares are injected and congested    No sinus tenderness or swelling    Right Ear: Tympanic membrane, ear canal and external ear normal.     Left Ear: Tympanic membrane, ear canal and external ear normal.     Nose: Congestion and rhinorrhea present.     Mouth/Throat:     Mouth: Mucous membranes are moist.     Pharynx: Oropharynx is clear. No oropharyngeal  exudate or posterior oropharyngeal erythema.     Comments: Clear pnd  Eyes:     General:        Right eye: No discharge.        Left eye: No discharge.     Conjunctiva/sclera: Conjunctivae normal.     Pupils: Pupils are equal, round, and reactive to light.  Neck:     Comments: Enlarged thyroid -larger on the R side  Not tender  Cardiovascular:     Rate and Rhythm: Normal rate.     Heart sounds: Normal heart sounds.  Pulmonary:     Effort: Pulmonary effort is normal. No respiratory distress.     Breath sounds: No stridor. Rhonchi present. No wheezing  or rales.     Comments: Scattered rhonchi  Wet sounding cough Few end exp wheezes  No rales Chest:     Chest wall: No tenderness.  Musculoskeletal:     Cervical back: Normal range of motion and neck supple.  Lymphadenopathy:     Cervical: No cervical adenopathy.  Skin:    General: Skin is warm and dry.     Capillary Refill: Capillary refill takes less than 2 seconds.     Findings: No rash.  Neurological:     Mental Status: She is alert.     Cranial Nerves: No cranial nerve deficit.  Psychiatric:        Mood and Affect: Mood normal.           Assessment & Plan:   Problem List Items Addressed This Visit       Respiratory   Acute bronchitis    S/p viral uri, now symptoms of sinusitis and bronchitis with mild reactive airways  Enlarged thyroid may be worsening throat symptoms as well  Disc symptom care Also ER precautions  Px augmentin and 30 mg pred taper, discussed side effects as well  Update if not starting to improve in a week or if worsening           Endocrine   Enlarged thyroid    She is more symptomatic with thyroid now-causing neck pressure and more hoarseness  Will discuss endocrinology ref with pcp

## 2021-08-21 NOTE — Assessment & Plan Note (Signed)
S/p viral uri, now symptoms of sinusitis and bronchitis with mild reactive airways  Enlarged thyroid may be worsening throat symptoms as well  Disc symptom care Also ER precautions  Px augmentin and 30 mg pred taper, discussed side effects as well  Update if not starting to improve in a week or if worsening

## 2021-08-24 NOTE — Progress Notes (Signed)
Left message to return call to our office.  

## 2021-09-06 ENCOUNTER — Encounter: Payer: Self-pay | Admitting: Primary Care

## 2021-09-06 ENCOUNTER — Ambulatory Visit (INDEPENDENT_AMBULATORY_CARE_PROVIDER_SITE_OTHER): Admitting: Primary Care

## 2021-09-06 VITALS — BP 124/72 | HR 84 | Temp 98.2°F | Ht 66.0 in | Wt 207.0 lb

## 2021-09-06 DIAGNOSIS — E049 Nontoxic goiter, unspecified: Secondary | ICD-10-CM | POA: Diagnosis not present

## 2021-09-06 DIAGNOSIS — K219 Gastro-esophageal reflux disease without esophagitis: Secondary | ICD-10-CM

## 2021-09-06 DIAGNOSIS — Z1159 Encounter for screening for other viral diseases: Secondary | ICD-10-CM | POA: Diagnosis not present

## 2021-09-06 LAB — T4, FREE: Free T4: 0.77 ng/dL (ref 0.60–1.60)

## 2021-09-06 LAB — TSH: TSH: 0.88 u[IU]/mL (ref 0.35–5.50)

## 2021-09-06 LAB — T3, FREE: T3, Free: 3.1 pg/mL (ref 2.3–4.2)

## 2021-09-06 MED ORDER — OMEPRAZOLE 20 MG PO CPDR: 20.0000 mg | DELAYED_RELEASE_CAPSULE | Freq: Every day | ORAL | 0 refills | Status: DC

## 2021-09-06 NOTE — Assessment & Plan Note (Signed)
Reviewed thyroid US from September 2022. Reviewed thyroid labs from August 2022.  Suspect that some of her symptoms today are residual from her viral illness in June and sinusitis in July. It's possible that she could be experiencing silent reflux which is aggravating her throat.  Will start omeprazole 20 mg daily x 2-3 weeks If no improvement the proceed with repeat thyroid ultrasound. Thyroid labs pending today

## 2021-09-06 NOTE — Patient Instructions (Signed)
Start omeprazole 20 mg once daily for coughing fits, throat fullness.  Stop by the lab prior to leaving today. I will notify you of your results once received.   Please update me via MyChart in 2-3 weeks.  It was a pleasure to see you today!

## 2021-09-06 NOTE — Progress Notes (Signed)
Subjective:    Patient ID: [name], female    DOB: [birth year], 36 y.o.   MRN: [id number]  Thyroid Problem Patient reports no cold intolerance or heat intolerance.    [name] is a very pleasant 36 y.o. female with a history of asthma, enlarged thyroid gland, GERD, hyperlipidemia who presents today to discuss her thyroid gland.  She has not been seen by me since January 2020.  History of enlarged thyroid gland. She underwent thyroid ultrasound in September 2022 which revealed large thyroid nodules to bilateral lobes inferiorly. History of thyroid biopsy to two nodules, both of which were determined benign.  Today she endorses anterior throat swelling over the area of her thyroid gland that began in June 2023 after an acute illness. Over the last few weeks she's continued to notice tightness to her throat with working out and with rest. She feels like she has on a turtle neck sweater when she does not.   She does have post nasal drip, hasn't fully recovered from her sinus infection in July, had a coughing fit today. Overall she feels well, not sick. She does have history of heartburn. She took some Zantac for a few days in July, unsure if this helped with her coughing fits.   She denies a family history of thyroid disease/cancer.   Review of Systems  Constitutional:  Negative for fever.  HENT:  Positive for postnasal drip. Negative for sore throat and trouble swallowing.   Respiratory:  Positive for cough.   Endocrine: Negative for cold intolerance and heat intolerance.         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   Depression    Exercise-induced asthma    GERD (gastroesophageal reflux disease)    Gestational diabetes    glyburide   Gestational diabetes mellitus 08/08/2016   History of asthma 10/06/2016   History of pre-eclampsia 06/12/2016   Hx of pre-eclampsia in prior pregnancy, currently pregnant    Obesity     Palpitations    normal cardiologist visit    Social History   Socioeconomic History   Marital status: Married    Spouse name: Not on file   Number of children: 1   Years of education: Not on file   Highest education level: Not on file  Occupational History   Not on file  Tobacco Use   Smoking status: Never   Smokeless tobacco: Never  Substance and Sexual Activity   Alcohol use: Yes    Comment: occas. when not preg   Drug use: No   Sexual activity: Not on file  Other Topics Concern   Not on file  Social History Narrative   Married.   1 son, pregnant.   Works as a Forensic psychologist, teaches high school computers.   Enjoys watching TV, reading, exercising.    Social Determinants of Health   Financial Resource Strain: Not on file  Food Insecurity: Not on file  Transportation Needs: Not on file  Physical Activity: Not on file  Stress: Not on file  Social Connections: Not on file  Intimate Partner Violence: Not on file    Past Surgical History:  Procedure Laterality Date   BREAST LUMPECTOMY     CESAREAN SECTION N/A 10/04/2014   Procedure: CESAREAN SECTION;  Surgeon: Crawford Givens, MD;  Location: [facility];  Service: Obstetrics;  Laterality: N/A;   CESAREAN SECTION N/A 10/07/2016   Procedure: CESAREAN SECTION;  Surgeon: Thurnell Lose,  MD;  Location: [facility];  Service: Obstetrics;  Laterality: N/A;   WISDOM TOOTH EXTRACTION  2010    Family History  Problem Relation Age of Onset   Hypertension Mother    Breast cancer Paternal Aunt    Diabetes Mellitus II Paternal Aunt    Healthy Father    Rheum arthritis Sister    Endometriosis Sister    Anxiety disorder Sister    Miscarriages / Stillbirths Sister    Post-traumatic stress disorder Sister    Other Sister        BENIGN BRAIN TUMOR   Diabetes Paternal Aunt    Hypertension Maternal Grandmother    Dementia Maternal Grandmother    Hypertension Paternal Grandmother    Aneurysm Paternal Grandmother     Diabetes Mellitus II Paternal Grandmother     Allergies  Allergen Reactions   Banana Nausea And Vomiting    Patient also experiences severe abdominal pain    Current Outpatient Medications on File Prior to Visit  Medication Sig Dispense Refill   albuterol (VENTOLIN HFA) 108 (90 Base) MCG/ACT inhaler Inhale 2 puffs into the lungs every 4 (four) hours as needed for wheezing or shortness of breath (cough, shortness of breath or wheezing.). 1 each 0   diphenhydrAMINE (BENADRYL) 25 MG tablet Take 50 mg by mouth every 6 (six) hours as needed for allergies.     hydrocortisone cream 1 % Apply 1 application topically 2 (two) times daily as needed for itching.      ibuprofen (ADVIL,MOTRIN) 600 MG tablet Take 1 tablet (600 mg total) by mouth every 6 (six) hours. 30 tablet 0   No current facility-administered medications on file prior to visit.    BP 124/72   Pulse 84   Temp 98.2 F (36.8 C) (Oral)   Ht 5\' 6"  (1.676 m)   Wt 207 lb (93.9 kg)   LMP 08/05/2021   SpO2 99%   BMI 33.41 kg/m  Objective:   Physical Exam Neck:     Thyroid: Thyromegaly present. No thyroid mass or thyroid tenderness.  Cardiovascular:     Rate and Rhythm: Normal rate and regular rhythm.  Pulmonary:     Effort: Pulmonary effort is normal.     Breath sounds: Normal breath sounds.  Musculoskeletal:     Cervical back: Neck supple.  Skin:    General: Skin is warm and dry.           Assessment & Plan:   Problem List Items Addressed This Visit       Digestive   GERD (gastroesophageal reflux disease)    Suspect that some of her symptoms today are residual from her viral illness in June and sinusitis in July. It's possible that she could be experiencing silent reflux which is aggravating her throat.  Will start omeprazole 20 mg daily x 2-3 weeks If no improvement the proceed with repeat thyroid ultrasound. Thyroid labs pending today      Relevant Medications   omeprazole (PRILOSEC) 20 MG capsule      Endocrine   Enlarged thyroid - Primary    Reviewed thyroid August from September 2022. Reviewed thyroid labs from August 2022.  Suspect that some of her symptoms today are residual from her viral illness in June and sinusitis in July. It's possible that she could be experiencing silent reflux which is aggravating her throat.  Will start omeprazole 20 mg daily x 2-3 weeks If no improvement the proceed with repeat thyroid ultrasound. Thyroid labs pending today  Relevant Orders   T4, free   Thyroid peroxidase antibody   TSH   T3, Free   Other Visit Diagnoses     Encounter for hepatitis C screening test for low risk patient       Relevant Orders   Hepatitis C Antibody          Doreene Nest, NP

## 2021-09-06 NOTE — Assessment & Plan Note (Signed)
Suspect that some of her symptoms today are residual from her viral illness in June and sinusitis in July. It's possible that she could be experiencing silent reflux which is aggravating her throat.  Will start omeprazole 20 mg daily x 2-3 weeks If no improvement the proceed with repeat thyroid ultrasound. Thyroid labs pending today

## 2021-09-07 LAB — HEPATITIS C ANTIBODY: Hepatitis C Ab: NONREACTIVE

## 2021-09-07 LAB — THYROID PEROXIDASE ANTIBODY: Thyroperoxidase Ab SerPl-aCnc: <1 IU/mL (ref <9)

## 2021-10-15 IMAGING — US US THYROID
1 series · 13 of 25 positions shown · non-contrast
Comparison: Multiple priors

CLINICAL DATA: enlarged thyroid, pain

EXAM:
THYROID ULTRASOUND
TECHNIQUE: Ultrasound examination of the thyroid gland and adjacent soft
tissues was performed.

[Series 1: us thyroid · 0.08mm/px · 13 of 48 slices shown]
[im 1/48]
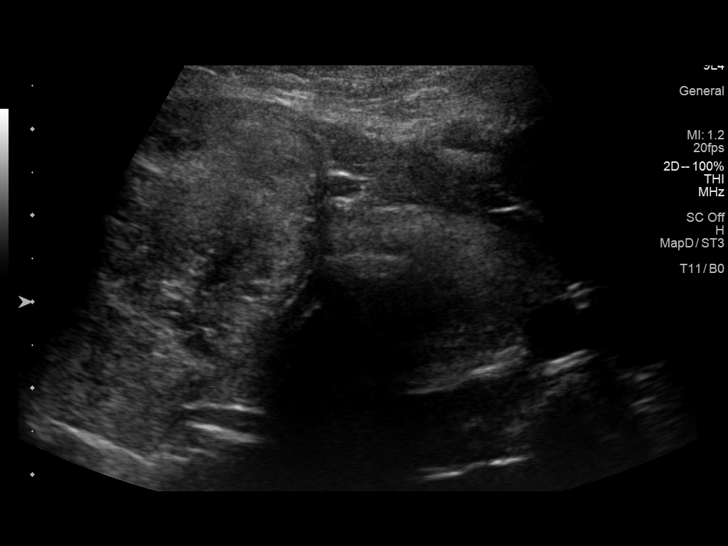
[im 4/48]
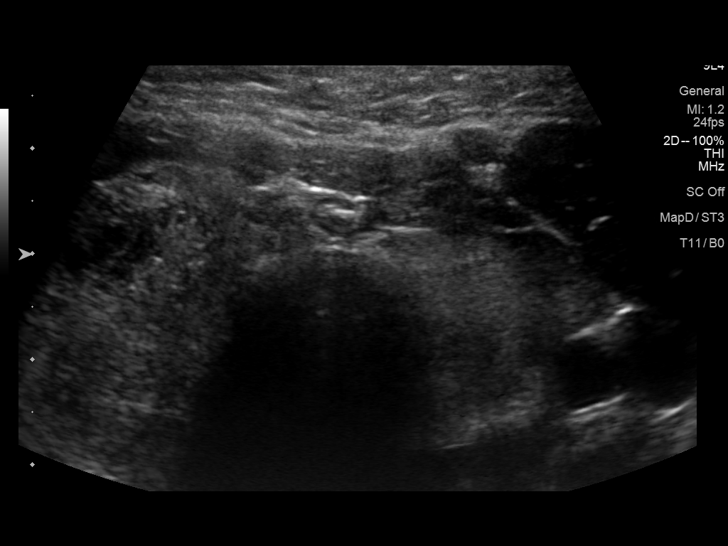
[im 8/48]
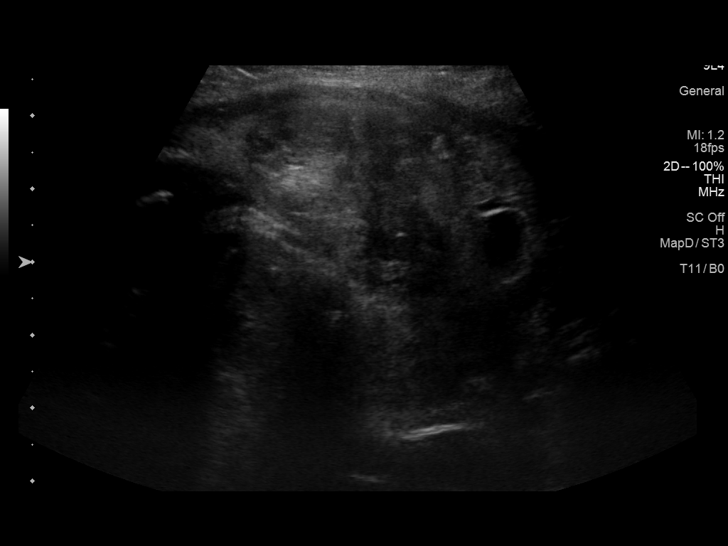
[im 12/48]
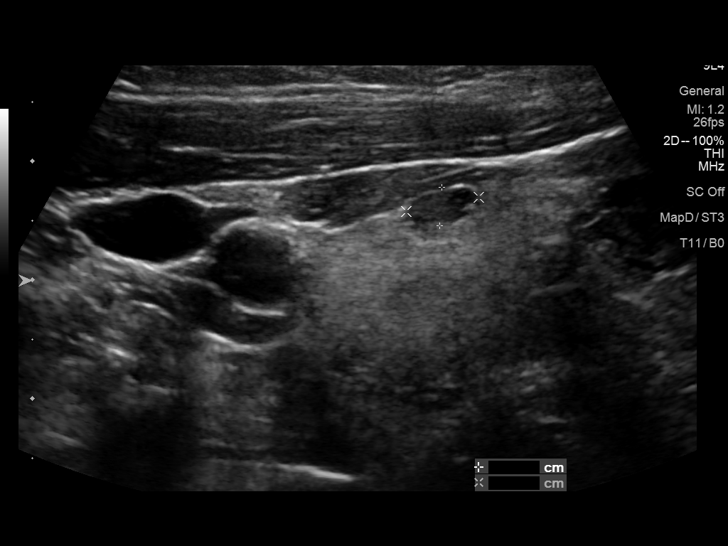
[im 16/48]
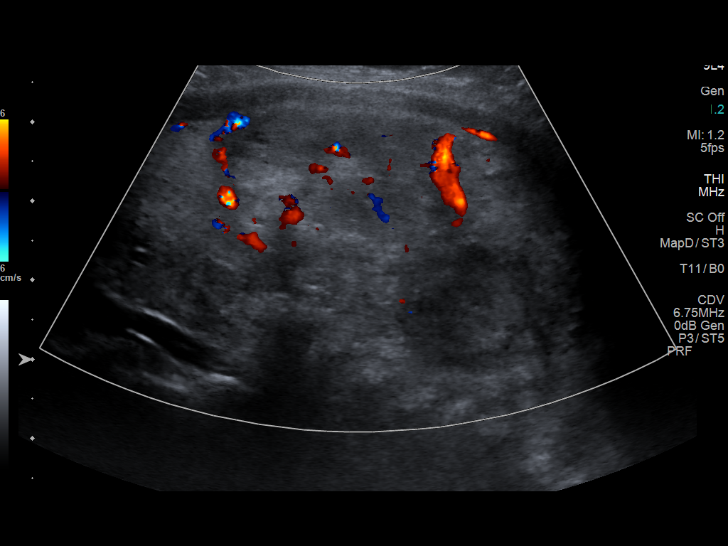
[im 20/48]
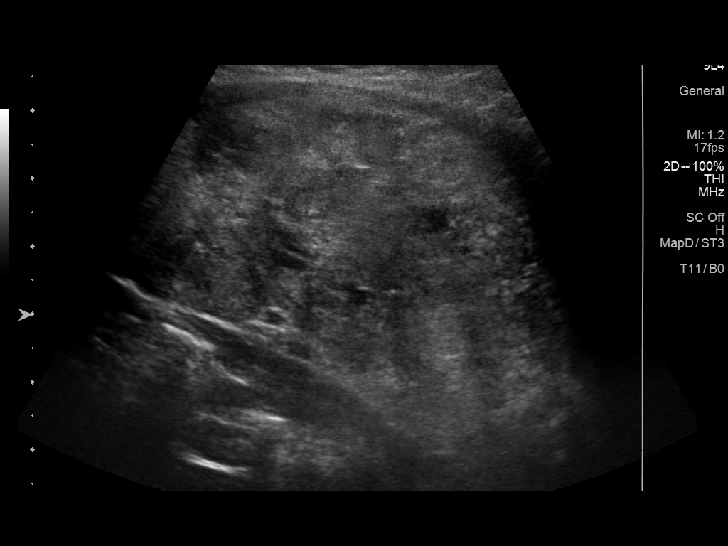
[im 24/48]
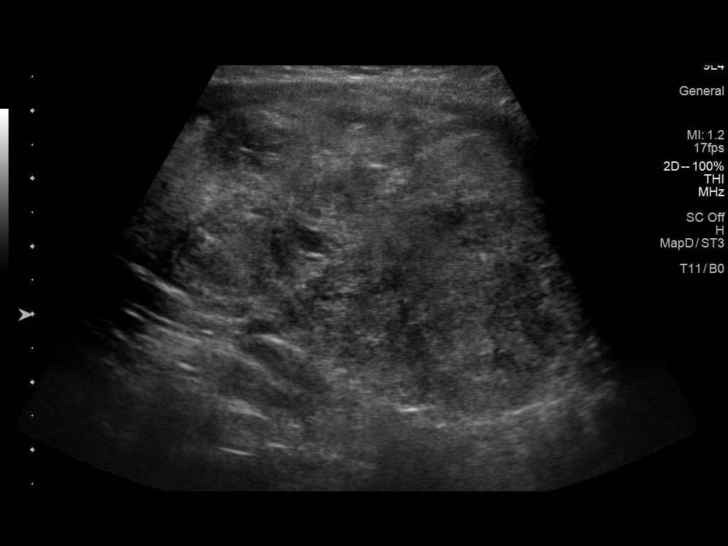
[im 28/48]
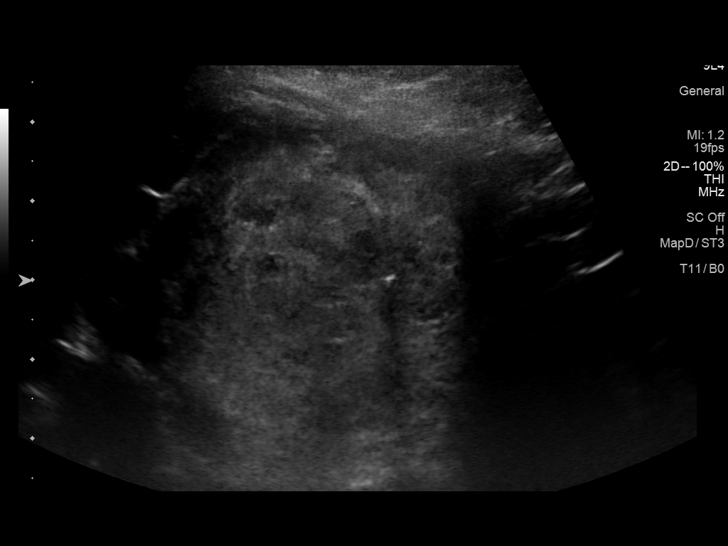
[im 32/48]
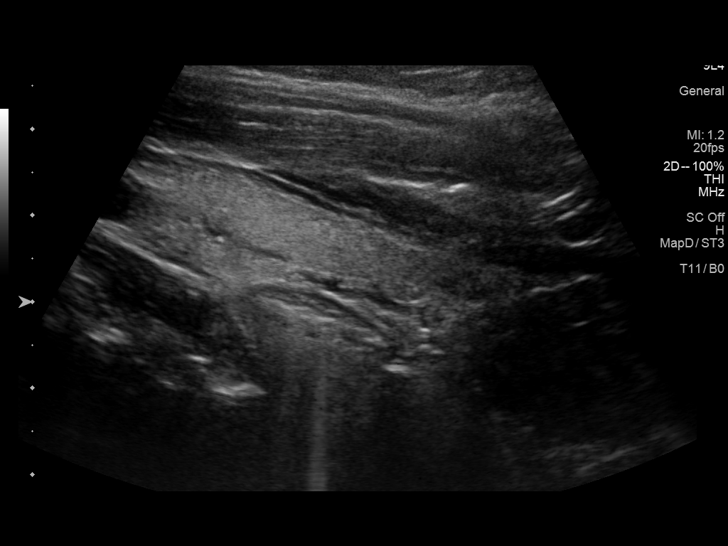
[im 36/48]
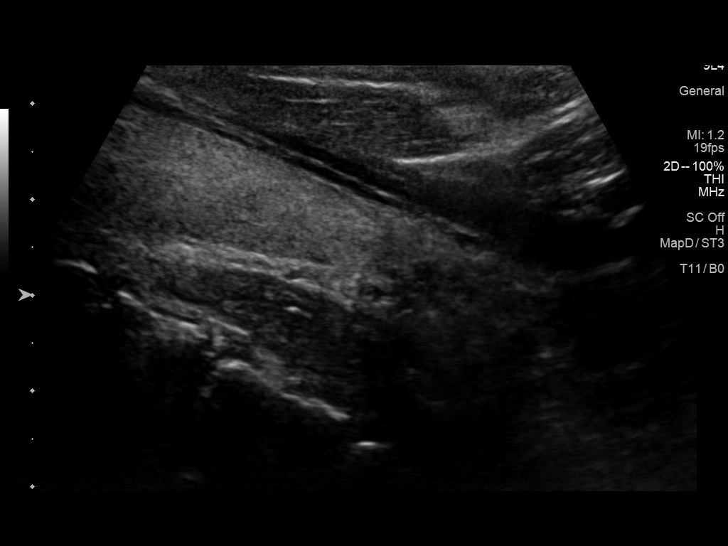
[im 40/48]
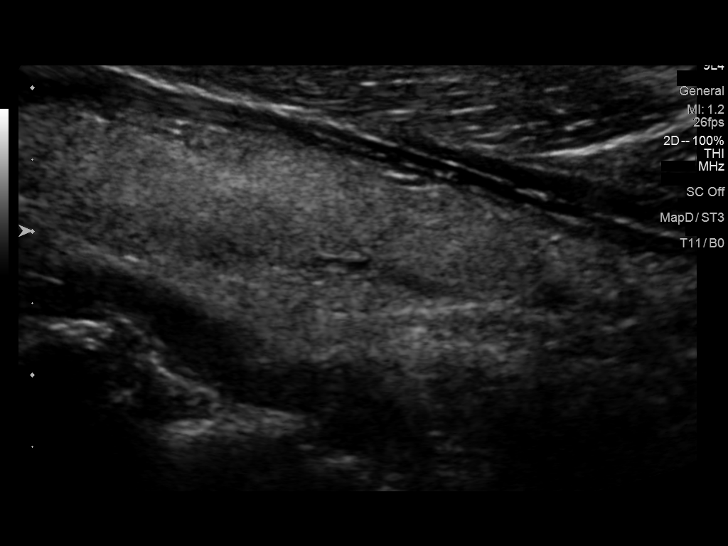
[im 44/48]
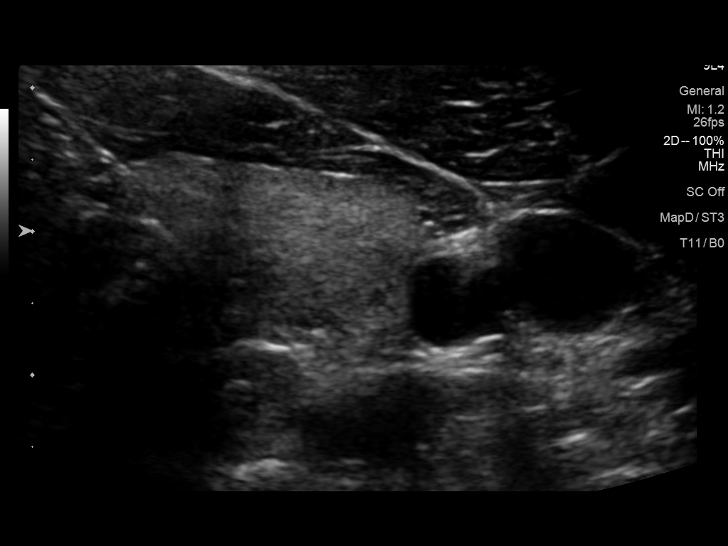
[im 48/48]
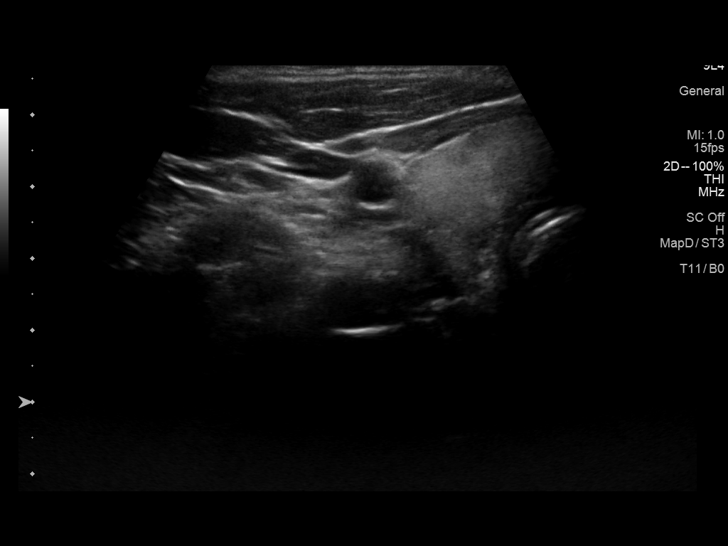

[13 of 25 positions shown; findings below may reference images not displayed]

FINDINGS: Parenchymal Echotexture: Mildly heterogenous

Isthmus:

Right lobe: 7.0 x 3.7 x 2.2 cm

Left lobe: 6.6 x 2.4 cm

_________________________________________________________

Estimated total number of nodules >/= 1 cm: 2

Number of spongiform nodules >/=  2 cm not described below (TR1): 0

Number of mixed cystic and solid nodules >/= 1.5 cm not described
below (TR2): 0

_________________________________________________________

Nodule labeled 1 refers to a small subcentimeter nodule does not
meet criteria for surveillance or biopsy.

Nodule labeled 2 refers to a large 6 cm nodule in the mid to
inferior aspect of the right thyroid lobe which was previously
biopsied. It remains similar in size and appearance.

Nodule labeled 3 refers to a large 3.9 cm in the inferior aspect of
the left thyroid lobe, which was previously biopsied. It remains
similar in size and appearance.

_________________________________________________________
IMPRESSION: Overall similar appearance of the bilateral, large thyroid nodules
in the inferior aspect of both the right and left thyroid lobes.
These were previously biopsied. Correlate with biopsy results. No
new or suspicious thyroid nodules.

## 2021-11-01 NOTE — Telephone Encounter (Signed)
Error

## 2021-12-04 ENCOUNTER — Other Ambulatory Visit: Payer: Self-pay | Admitting: Primary Care

## 2021-12-04 DIAGNOSIS — K219 Gastro-esophageal reflux disease without esophagitis: Secondary | ICD-10-CM

## 2021-12-04 NOTE — Telephone Encounter (Signed)
Please call patient:  Is she still taking omeprazole that was prescribed in August for her throat swelling/coughing spells?

## 2021-12-05 NOTE — Telephone Encounter (Signed)
Spoke with patient, she states the request to refill medication was an accident. She does not need it anymore.

## 2022-02-24 ENCOUNTER — Telehealth: Payer: Self-pay | Admitting: Family Medicine

## 2022-02-24 DIAGNOSIS — Z8709 Personal history of other diseases of the respiratory system: Secondary | ICD-10-CM

## 2022-02-24 DIAGNOSIS — U071 COVID-19: Secondary | ICD-10-CM

## 2022-02-24 MED ORDER — PROMETHAZINE-DM 6.25-15 MG/5ML PO SYRP: 5.0000 mL | ORAL_SOLUTION | Freq: Four times a day (QID) | ORAL | 0 refills | Status: DC | PRN

## 2022-02-24 MED ORDER — NIRMATRELVIR/RITONAVIR (PAXLOVID)TABLET: 3.0000 | ORAL_TABLET | Freq: Two times a day (BID) | ORAL | 0 refills | Status: AC

## 2022-02-24 MED ORDER — PREDNISONE 20 MG PO TABS: 20.0000 mg | ORAL_TABLET | Freq: Two times a day (BID) | ORAL | 0 refills | Status: AC

## 2022-02-24 NOTE — Progress Notes (Signed)
Virtual Visit Consent   [name], you are scheduled for a virtual visit with a [facility] provider today. Just as with appointments in the office, your consent must be obtained to participate. Your consent will be active for this visit and any virtual visit you may have with one of our providers in the next 365 days. If you have a MyChart account, a copy of this consent can be sent to you electronically.  As this is a virtual visit, video technology does not allow for your provider to perform a traditional examination. This may limit your provider's ability to fully assess your condition. If your provider identifies any concerns that need to be evaluated in person or the need to arrange testing (such as labs, EKG, etc.), we will make arrangements to do so. Although advances in technology are sophisticated, we cannot ensure that it will always work on either your end or our end. If the connection with a video visit is poor, the visit may have to be switched to a telephone visit. With either a video or telephone visit, we are not always able to ensure that we have a secure connection.  By engaging in this virtual visit, you consent to the provision of healthcare and authorize for your insurance to be billed (if applicable) for the services provided during this visit. Depending on your insurance coverage, you may receive a charge related to this service.  I need to obtain your verbal consent now. Are you willing to proceed with your visit today? [name] has provided verbal consent on 02/24/2022 for a virtual visit (video or telephone). Dellia Nims, FNP  Date: 02/24/2022 1:11 PM  Virtual Visit via Video Note   I, Dellia Nims, connected with  [name]  ([id number], Jul 19, 1985) on 02/24/22 at  1:30 PM EST by a video-enabled telemedicine application and verified that I am speaking with the correct person using two identifiers.  Location: Patient: Virtual Visit Location Patient:  Home Provider: Virtual Visit Location Provider: Home Office   I discussed the limitations of evaluation and management by telemedicine and the availability of in person appointments. The patient expressed understanding and agreed to proceed.    History of Present Illness: [name] is a 37 y.o. who identifies as a female who was assigned female at birth, and is being seen today for covid positive testing at home today with sx starting today. Has chills, aches, cough, head congestion, no wheezing or sob at this time. She has a history of asthma. 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 (Take nirmatrelvir 150 mg two tablets twice daily for 5 days and ritonavir 100 mg one tablet twice daily for 5 days) Patient GFR is 83, Disp: 30 tablet, Rfl: 0   predniSONE (DELTASONE) 20 MG tablet, Take 1 tablet (20 mg total) by mouth 2 (two) times  daily with a meal for 5 days., Disp: 10 tablet, Rfl: 0   promethazine-dextromethorphan (PROMETHAZINE-DM) 6.25-15 MG/5ML syrup, Take 5 mLs by mouth 4 (four) times daily as needed for cough., Disp: 118 mL, Rfl: 0   albuterol (VENTOLIN HFA) 108 (90 Base) MCG/ACT inhaler, Inhale 2 puffs into the lungs every 4 (four) hours as needed for wheezing or shortness of breath (cough, shortness of breath or wheezing.)., Disp: 1 each, Rfl: 0   diphenhydrAMINE (BENADRYL)  25 MG tablet, Take 50 mg by mouth every 6 (six) hours as needed for allergies., Disp: , Rfl:    hydrocortisone cream 1 %, Apply 1 application topically 2 (two) times daily as needed for itching. , Disp: , Rfl:    ibuprofen (ADVIL,MOTRIN) 600 MG tablet, Take 1 tablet (600 mg total) by mouth every 6 (six) hours., Disp: 30 tablet, Rfl: 0   omeprazole (PRILOSEC) 20 MG capsule, Take 1 capsule (20 mg total) by mouth daily. For cough and throat fullness, Disp: 90 capsule, Rfl: 0  Observations/Objective: Patient is well-developed, well-nourished in no acute distress.  Resting comfortably  at home.  Head is normocephalic, atraumatic.  No labored breathing.  Speech is clear and coherent with logical content.  Patient is alert and oriented at baseline.    Assessment and Plan: 1. COVID-19  2. History of asthma  Increase fluids, humidifier at night, tylenol or ibuprofen as directed, urgent care for worsening respiratory problems.   Follow Up Instructions: I discussed the assessment and treatment plan with the patient. The patient was provided an opportunity to ask questions and all were answered. The patient agreed with the plan and demonstrated an understanding of the instructions.  A copy of instructions were sent to the patient via MyChart unless otherwise noted below.     The patient was advised to call back or seek an in-person evaluation if the symptoms worsen or if the condition fails to improve as anticipated.  Time:  I spent 10 minutes with the patient via telehealth technology discussing the above problems/concerns.    Dellia Nims, FNP

## 2022-02-24 NOTE — Patient Instructions (Signed)
Quarantine and Isolation Quarantine and isolation refer to local and travel restrictions to protect the public and travelers from contagious diseases that constitute a public health threat. Contagious diseases are diseases that can spread from one person to another. Quarantine and isolation help to protect the public by preventing exposure to people who have or may have a contagious disease. Isolation separates people who are sick with a contagious disease from people who are not sick. Quarantine separates and restricts the movement of people who were exposed to a contagious disease to see if they become sick. You may be put in quarantine or isolation if you have been exposed to or diagnosed with any of the following diseases: Severe acute respiratory syndromes, such as COVID-19. Cholera. Diphtheria. Tuberculosis. Plague. Smallpox. Yellow fever. Viral hemorrhagic fevers, such as [place], Ebola, and [place]. When to quarantine or isolate Follow these rules, whether you have been vaccinated or not: Stay home and isolate from others when you are sick with a contagious disease. Isolate when you test positive for a contagious disease, even if you do not have symptoms. Isolate if you are sick and suspect that you may have a contagious disease. If you suspect that you have a contagious disease, get tested. If your test results are negative, you can end your isolation. If your test results are positive, follow the full isolation recommendations as told by your health care provider or local health authorities. Quarantine and stay away from others when you have been in close contact with someone who has tested positive for a contagious disease. Close contact is defined as being less than 6 ft (1.8 m) away from an infected person for a total of 15 minutes or more over a 24-hour period. Do not go to places where you are unable to wear a mask, such as restaurants and some gyms. Stay home and separate  from others as much as possible. Avoid being around people who may get very sick from the contagious disease that you have. Use a separate bathroom, if possible. Do not travel. For travel guidance, visit the CDC's travel webpage at wwwnc.cdc.gov/travel/ Follow these instructions at home: Medicines  Take over-the-counter and prescription medicines as told by your health care provider. Finish all antibiotic medicine even when you start to feel better. Stay up to date with all your vaccines. Get scheduled vaccines and boosters as recommended by your health care provider. Lifestyle Wear a high-quality mask if you must be around others at home and in public, if recommended. Improve air flow (ventilation) at home to help prevent the disease from spreading to other people, if possible. Do not share personal household items, like cups, towels, and utensils. Practice everyday hygiene and cleaning. General instructions Talk to your health care provider if you have a weakened body defense system (immune system). People with a weakened immune system may have a reduced immune response to vaccines. You may need to follow current prevention measures, including wearing a well-fitting mask, avoiding crowds, and avoiding poorly ventilated indoor places. Monitor symptoms and follow health care provider instructions, which may include resting, drinking fluids, and taking medicines. Follow specific isolation and quarantine recommendations if you are in places that can lead to disease outbreaks, such as correctional and detention facilities, homeless shelters, and cruise ships. Return to your normal activities as told by your health care provider. Ask your health care provider what activities are safe for you. Keep all follow-up visits. This is important. Where to find more information CDC: www.cdc.gov/quarantine/index.html Contact   a health care provider if: You have a fever. You have signs and symptoms that  return or get worse after isolation. Get help right away if: You have difficulty breathing. You have chest pain. These symptoms may be an emergency. Get help right away. Call 911. Do not wait to see if the symptoms will go away. Do not drive yourself to the hospital. Summary Isolation and quarantine help protect the public by preventing exposure to people who have or may have a contagious disease. Isolate when you are sick or when you test positive, even if you do not have symptoms. Quarantine and stay away from others when you have been in close contact with someone who has tested positive for a contagious disease. This information is not intended to replace advice given to you by your health care provider. Make sure you discuss any questions you have with your health care provider. Document Revised: 01/26/2021 Document Reviewed: 01/05/2021 Elsevier Patient Education  2023 [employer] COVID-19 COVID-19, or coronavirus disease 2019, is an infection that is caused by a new (novel) coronavirus called SARS-CoV-2. COVID-19 can cause many symptoms. In some people, the virus may not cause any symptoms. In others, it may cause mild or severe symptoms. Some people with severe infection develop severe disease. What are the causes? This illness is caused by a virus. The virus may be in the air as tiny specks of fluid (aerosols) or droplets, or it may be on surfaces. You may catch the virus by: Breathing in droplets from an infected person. Droplets can be spread by a person breathing, speaking, singing, coughing, or sneezing. Touching something, like a table or a doorknob, that has virus on it (is contaminated) and then touching your mouth, nose, or eyes. What increases the risk? Risk for infection: You are more likely to get infected with the COVID-19 virus if: You are within 6 ft (1.8 m) of a person with COVID-19 for 15 minutes or longer. You are providing care for a person who is infected with  COVID-19. You are in close personal contact with other people. Close personal contact includes hugging, kissing, or sharing eating or drinking utensils. Risk for serious illness caused by COVID-19: You are more likely to get seriously ill from the COVID-19 virus if: You have cancer. You have a long-term (chronic) disease, such as: Chronic lung disease. This includes pulmonary embolism, chronic obstructive pulmonary disease, and cystic fibrosis. Long-term disease that lowers your body's ability to fight infection (immunocompromise). Serious cardiac conditions, such as heart failure, coronary artery disease, or cardiomyopathy. Diabetes. Chronic kidney disease. Liver diseases. These include cirrhosis, nonalcoholic fatty liver disease, alcoholic liver disease, or autoimmune hepatitis. You have obesity. You are pregnant or were recently pregnant. You have sickle cell disease. What are the signs or symptoms? Symptoms of this condition can range from mild to severe. Symptoms may appear any time from 2 to 14 days after being exposed to the virus. They include: Fever or chills. Shortness of breath or trouble breathing. Feeling tired or very tired. Headaches, body aches, or muscle aches. Runny or stuffy nose, sneezing, coughing, or sore throat. New loss of taste or smell. This is rare. Some people may also have stomach problems, such as nausea, vomiting, or diarrhea. Other people may not have any symptoms of COVID-19. How is this diagnosed? This condition may be diagnosed by testing samples to check for the COVID-19 virus. The most common tests are the PCR test and the antigen test. Tests may be done   in the lab or at home. They include: Using a swab to take a sample of fluid from the back of your nose and throat (nasopharyngeal fluid), from your nose, or from your throat. Testing a sample of saliva from your mouth. Testing a sample of coughed-up mucus from your lungs (sputum). How is this  treated? Treatment for COVID-19 infection depends on the severity of the condition. Mild symptoms can be managed at home with rest, fluids, and over-the-counter medicines. Serious symptoms may be treated in a hospital intensive care unit (ICU). Treatment in the ICU may include: Supplemental oxygen. Extra oxygen is given through a tube in the nose, a face mask, or a hood. Medicines. These may include: Antivirals, such as monoclonal antibodies. These help your body fight off certain viruses that can cause disease. Anti-inflammatories, such as corticosteroids. These reduce inflammation and suppress the immune system. Antithrombotics. These prevent or treat blood clots, if they develop. Convalescent plasma. This helps boost your immune system, if you have an underlying immunosuppressive condition or are getting immunosuppressive treatments. Prone positioning. This means you will lie on your stomach. This helps oxygen to get into your lungs. Infection control measures. If you are at risk for more serious illness caused by COVID-19, your health care provider may prescribe two long-acting monoclonal antibodies, given together every 6 months. How is this prevented? To protect yourself: Use preventive medicine (pre-exposure prophylaxis). You may get pre-exposure prophylaxis if you have moderate or severe immunocompromise. Get vaccinated. Anyone 6 months old or older who meets guidelines can get a COVID-19 vaccine or vaccine series. This includes people who are pregnant or making breast milk (lactating). Get an added dose of COVID-19 vaccine after your first vaccine or vaccine series if you have moderate to severe immunocompromise. This applies if you have had a solid organ transplant or have been diagnosed with an immunocompromising condition. You should get the added dose 4 weeks after you got the first COVID-19 vaccine or vaccine series. If you get an mRNA vaccine, you will need a 3-dose primary  series. If you get the J&J/Janssen vaccine, you will need a 2-dose primary series, with the second dose being an mRNA vaccine. Talk to your health care provider about getting experimental monoclonal antibodies. This treatment is approved under emergency use authorization to prevent severe illness before or after being exposed to the COVID-19 virus. You may be given monoclonal antibodies if: You have moderate or severe immunocompromise. This includes treatments that lower your immune response. People with immunocompromise may not develop protection against COVID-19 when they are vaccinated. You cannot be vaccinated. You may not get a vaccine if you have a severe allergic reaction to the vaccine or its components. You are not fully vaccinated. You are in a facility where COVID-19 is present and: Are in close contact with a person who is infected with the COVID-19 virus. Are at high risk of being exposed to the COVID-19 virus. You are at risk of illness from new variants of the COVID-19 virus. To protect others: If you have symptoms of COVID-19, take steps to prevent the virus from spreading to others. Stay home. Leave your house only to get medical care. Do not use public transit, if possible. Do not travel while you are sick. Wash your hands often with soap and water for at least 20 seconds. If soap and water are not available, use alcohol-based hand sanitizer. Make sure that all people in your household wash their hands well and often. Cough or sneeze   into a tissue or your sleeve or elbow. Do not cough or sneeze into your hand or into the air. Where to find more information Centers for Disease Control and Prevention: www.cdc.gov/coronavirus World Health Organization: www.who.int/health-topics/coronavirus Get help right away if: You have trouble breathing. You have pain or pressure in your chest. You are confused. You have bluish lips and fingernails. You have trouble waking from sleep. You  have symptoms that get worse. These symptoms may be an emergency. Get help right away. Call 911. Do not wait to see if the symptoms will go away. Do not drive yourself to the hospital. Summary COVID-19 is an infection that is caused by a new coronavirus. Sometimes, there are no symptoms. Other times, symptoms range from mild to severe. Some people with a severe COVID-19 infection develop severe disease. The virus that causes COVID-19 can spread from person to person through droplets or aerosols from breathing, speaking, singing, coughing, or sneezing. Mild symptoms of COVID-19 can be managed at home with rest, fluids, and over-the-counter medicines. This information is not intended to replace advice given to you by your health care provider. Make sure you discuss any questions you have with your health care provider. Document Revised: 01/03/2021 Document Reviewed: 01/05/2021 Elsevier Patient Education  2023 [employer] Inc[employer]  

## 2022-11-27 ENCOUNTER — Other Ambulatory Visit (HOSPITAL_COMMUNITY): Payer: Self-pay

## 2022-11-27 MED ORDER — WEGOVY 0.25 MG/0.5ML ~~LOC~~ SOAJ
0.2500 mg | SUBCUTANEOUS | 0 refills | Status: DC
  Filled 2022-11-27 – 2022-11-30 (×3): qty 2, 28d supply, fill #0

## 2022-11-28 ENCOUNTER — Other Ambulatory Visit (HOSPITAL_COMMUNITY): Payer: Self-pay

## 2022-11-30 ENCOUNTER — Other Ambulatory Visit (HOSPITAL_COMMUNITY): Payer: Self-pay

## 2022-12-19 ENCOUNTER — Other Ambulatory Visit (HOSPITAL_COMMUNITY): Payer: Self-pay

## 2022-12-19 MED ORDER — WEGOVY 0.25 MG/0.5ML ~~LOC~~ SOAJ
0.2500 mg | SUBCUTANEOUS | 0 refills | Status: DC
  Filled 2022-12-19: qty 2, 28d supply, fill #0

## 2023-05-03 ENCOUNTER — Encounter (INDEPENDENT_AMBULATORY_CARE_PROVIDER_SITE_OTHER): Payer: Self-pay | Admitting: Otolaryngology

## 2023-05-03 ENCOUNTER — Ambulatory Visit: Admitting: Primary Care

## 2023-05-03 ENCOUNTER — Encounter: Payer: Self-pay | Admitting: Primary Care

## 2023-05-03 VITALS — BP 104/70 | HR 60 | Temp 98.5°F | Ht 65.5 in | Wt 210.0 lb

## 2023-05-03 DIAGNOSIS — Z0001 Encounter for general adult medical examination with abnormal findings: Secondary | ICD-10-CM | POA: Diagnosis not present

## 2023-05-03 DIAGNOSIS — J45990 Exercise induced bronchospasm: Secondary | ICD-10-CM | POA: Diagnosis not present

## 2023-05-03 DIAGNOSIS — R0989 Other specified symptoms and signs involving the circulatory and respiratory systems: Secondary | ICD-10-CM | POA: Diagnosis not present

## 2023-05-03 DIAGNOSIS — R1012 Left upper quadrant pain: Secondary | ICD-10-CM

## 2023-05-03 DIAGNOSIS — E785 Hyperlipidemia, unspecified: Secondary | ICD-10-CM | POA: Diagnosis not present

## 2023-05-03 DIAGNOSIS — E049 Nontoxic goiter, unspecified: Secondary | ICD-10-CM | POA: Diagnosis not present

## 2023-05-03 DIAGNOSIS — R10A2 Flank pain, left side: Secondary | ICD-10-CM

## 2023-05-03 DIAGNOSIS — G8929 Other chronic pain: Secondary | ICD-10-CM

## 2023-05-03 LAB — URINALYSIS, MICROSCOPIC ONLY

## 2023-05-03 LAB — COMPREHENSIVE METABOLIC PANEL WITH GFR
ALT: 10 U/L (ref 0–35)
AST: 14 U/L (ref 0–37)
Albumin: 4.6 g/dL (ref 3.5–5.2)
Alkaline Phosphatase: 45 U/L (ref 39–117)
BUN: 13 mg/dL (ref 6–23)
CO2: 28 meq/L (ref 19–32)
Calcium: 9.2 mg/dL (ref 8.4–10.5)
Chloride: 104 meq/L (ref 96–112)
Creatinine, Ser: 0.95 mg/dL (ref 0.40–1.20)
GFR: 75.99 mL/min (ref >60.00)
Glucose, Bld: 101 mg/dL — ABNORMAL HIGH (ref 70–99)
Potassium: 4.1 meq/L (ref 3.5–5.1)
Sodium: 139 meq/L (ref 135–145)
Total Bilirubin: 0.6 mg/dL (ref 0.2–1.2)
Total Protein: 7.1 g/dL (ref 6.0–8.3)

## 2023-05-03 LAB — CBC
HCT: 38.5 % (ref 36.0–46.0)
Hemoglobin: 12.6 g/dL (ref 12.0–15.0)
MCHC: 32.8 g/dL (ref 30.0–36.0)
MCV: 82.4 fl (ref 78.0–100.0)
Platelets: 308 10*3/uL (ref 150.0–400.0)
RBC: 4.67 Mil/uL (ref 3.87–5.11)
RDW: 13.7 % (ref 11.5–15.5)
WBC: 5.7 10*3/uL (ref 4.0–10.5)

## 2023-05-03 LAB — POC URINALSYSI DIPSTICK (AUTOMATED)
Bilirubin, UA: NEGATIVE
Glucose, UA: NEGATIVE
Ketones, UA: NEGATIVE
Leukocytes, UA: NEGATIVE
Nitrite, UA: NEGATIVE
Protein, UA: POSITIVE — AB
Spec Grav, UA: >=1.03 — AB (ref 1.010–1.025)
Urobilinogen, UA: 0.2 U/dL
pH, UA: 5.5 (ref 5.0–8.0)

## 2023-05-03 LAB — LIPID PANEL
Cholesterol: 192 mg/dL (ref 0–200)
HDL: 39.8 mg/dL (ref >39.00)
LDL Cholesterol: 137 mg/dL — ABNORMAL HIGH (ref 0–99)
NonHDL: 152.26
Total CHOL/HDL Ratio: 5
Triglycerides: 74 mg/dL (ref 0.0–149.0)
VLDL: 14.8 mg/dL (ref 0.0–40.0)

## 2023-05-03 LAB — LIPASE: Lipase: 16 U/L (ref 11.0–59.0)

## 2023-05-03 LAB — TSH: TSH: 2.24 u[IU]/mL (ref 0.35–5.50)

## 2023-05-03 LAB — HEMOGLOBIN A1C: Hgb A1c MFr Bld: 5.6 % (ref 4.6–6.5)

## 2023-05-03 NOTE — Assessment & Plan Note (Signed)
 Unclear etiology today, likely MSK but will rule out other causes.  UA today pending. Consider imaging.

## 2023-05-03 NOTE — Assessment & Plan Note (Signed)
 Likely secondary to enlarged thyroid which is evident on exam. Thyroid US ordered and pending. Referral placed to ENT.

## 2023-05-03 NOTE — Assessment & Plan Note (Signed)
 Repeat lipid panel pending.

## 2023-05-03 NOTE — Assessment & Plan Note (Addendum)
 Evident on exam today. Repeat thyroid US ordered and pending. TSH pending.

## 2023-05-03 NOTE — Assessment & Plan Note (Signed)
 Immunizations UTD. Pap smear UTD.  Follows with GYN  Discussed the importance of a healthy diet and regular exercise in order for weight loss, and to reduce the risk of further co-morbidity.  Exam stable. Labs pending.  Follow up in 1 year for repeat physical.

## 2023-05-03 NOTE — Progress Notes (Signed)
 Subjective:    Patient ID: [name], female    DOB: October 15, 1985, 38 y.o.   MRN: [id number]  HPI  [name] is a very pleasant 38 y.o. female with a history of enlarged thyroid with multiple nodules, exercise induced asthma, GERD who presents today for complete physical and follow up of chronic conditions. She would also like to discuss throat fullness.   She continues to notice anterior neck tightness and pressure, notices throughout the day, feels her throat is restricted when laying down at times and with work outs. History of enlarged thyroid. Last thyroid ultrasound was in 2022, 3 nodules, no new thyroid nodules or suspicious nodules, follow up recommended. She denies difficulty swallowing but does notice incomplete swallowing. She has never seen ENT.  She would also like to discuss left flank and left upper abdominal pain. Symptoms have been chronic for the last 3 months, occur for a few hours, notices with and without movement, notices at rest, symptoms are sporadic. She exercises at the gym regularly, isn't sure if symptoms are MSK or not. She follows with a chiropractor every 2 weeks. She denies dysuria, foul smelling urine, radiation of pain, constipation, bowel changes, hematuria, symptoms with eating or drinking.   Immunizations: -Tetanus: Completed in 2018  Diet: Fair diet.  Exercise: Regular exercise.  Eye exam: Completes annually  Dental exam: Completes semi-annually    Pap Smear: Completed in March 2021   BP Readings from Last 3 Encounters:  05/03/23 104/70  09/06/21 124/72  08/21/21 118/78      Review of Systems  Constitutional:  Negative for unexpected weight change.  HENT:  Negative for rhinorrhea, sore throat and trouble swallowing.   Respiratory:  Negative for cough and shortness of breath.   Cardiovascular:  Negative for chest pain.  Gastrointestinal:  Positive for abdominal pain. Negative for constipation and diarrhea.  Genitourinary:  Negative  for difficulty urinating and menstrual problem.  Musculoskeletal:  Negative for arthralgias and myalgias.  Skin:  Negative for rash.  Allergic/Immunologic: Negative for environmental allergies.  Neurological:  Negative for dizziness and headaches.  Psychiatric/Behavioral:  The patient is not nervous/anxious.          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   Depression    Exercise-induced asthma    GERD (gastroesophageal reflux disease)    Gestational diabetes    glyburide   Gestational diabetes mellitus 08/08/2016   History of asthma 10/06/2016   History of pre-eclampsia 06/12/2016   Hx of pre-eclampsia in prior pregnancy, currently pregnant    Obesity    Palpitations    normal cardiologist visit    Social History   Socioeconomic History   Marital status: Married    Spouse name: Not on file   Number of children: 1   Years of education: Not on file   Highest education level: Not on file  Occupational History   Not on file  Tobacco Use   Smoking status: Never   Smokeless tobacco: Never  Substance and Sexual Activity   Alcohol use: Yes    Comment: occas. when not preg   Drug use: No   Sexual activity: Not on file  Other Topics Concern   Not on file  Social History Narrative   Married.   1 son, pregnant.   Works as a Customer service manager, teaches high school computers.   Enjoys watching TV, reading, exercising.    Social Drivers of Dispensing optician  Resource Strain: Not on file  Food Insecurity: Not on file  Transportation Needs: Not on file  Physical Activity: Not on file  Stress: Not on file  Social Connections: Unknown (12/24/2019)   Received from [facility], [facility]   Social Connections    Frequency of Communication with Friends and Family: Not asked    Frequency of Social Gatherings with Friends and Family: Not asked  Intimate Partner Violence: Unknown (12/24/2019)   Received from [facility], [facility]   Intimate Partner Violence    Fear of Current or Ex-Partner: Not asked    Emotionally Abused: Not asked    Physically Abused: Not asked    Sexually Abused: Not asked    Past Surgical History:  Procedure Laterality Date   BREAST LUMPECTOMY     CESAREAN SECTION N/A 10/04/2014   Procedure: CESAREAN SECTION;  Surgeon: Jaymes Graff, MD;  Location: [facility];  Service: Obstetrics;  Laterality: N/A;   CESAREAN SECTION N/A 10/07/2016   Procedure: CESAREAN SECTION;  Surgeon: Geryl Rankins, MD;  Location: [facility] BIRTHING SUITES;  Service: Obstetrics;  Laterality: N/A;   WISDOM TOOTH EXTRACTION  2010    Family History  Problem Relation Age of Onset   Hypertension Mother    Breast cancer Paternal Aunt    Diabetes Mellitus II Paternal Aunt    Healthy Father    Rheum arthritis Sister    Endometriosis Sister    Anxiety disorder Sister    Miscarriages / Stillbirths Sister    Post-traumatic stress disorder Sister    Other Sister        BENIGN BRAIN TUMOR   Diabetes Paternal Aunt    Hypertension Maternal Grandmother    Dementia Maternal Grandmother    Hypertension Paternal Grandmother    Aneurysm Paternal Grandmother    Diabetes Mellitus II Paternal Grandmother     Allergies  Allergen Reactions   Banana Nausea And Vomiting    Patient also experiences severe abdominal pain    Current Outpatient Medications on File Prior to Visit  Medication Sig Dispense Refill   albuterol (VENTOLIN HFA) 108 (90 Base) MCG/ACT inhaler Inhale 2 puffs into the lungs every 4 (four) hours as needed for wheezing or shortness of breath (cough, shortness of breath or wheezing.). 1 each 0   hydrocortisone cream 1 % Apply 1 application topically 2 (two) times daily as needed for itching.      ibuprofen (ADVIL,MOTRIN) 600 MG tablet Take 1 tablet (600 mg total) by mouth every 6 (six) hours. 30 tablet 0   No current facility-administered medications on file prior to visit.    BP 104/70 (BP Location: Left Arm,  Patient Position: Sitting, Cuff Size: Large)   Pulse 60   Temp 98.5 F (36.9 C) (Oral)   Ht 5' 5.5" (1.664 m)   Wt 210 lb (95.3 kg)   LMP 04/08/2023   SpO2 97%   BMI 34.41 kg/m  Objective:   Physical Exam HENT:     Right Ear: Tympanic membrane and ear canal normal.     Left Ear: Tympanic membrane and ear canal normal.  Eyes:     Pupils: Pupils are equal, round, and reactive to light.  Neck:     Thyroid: Thyromegaly present. No thyroid mass or thyroid tenderness.   Cardiovascular:     Rate and Rhythm: Normal rate and regular rhythm.  Pulmonary:     Effort: Pulmonary effort is normal.     Breath sounds: Normal breath sounds.  Abdominal:  General: Bowel sounds are normal.     Palpations: Abdomen is soft.     Tenderness: There is no abdominal tenderness. There is no right CVA tenderness or left CVA tenderness.  Musculoskeletal:        General: Normal range of motion.     Cervical back: Neck supple.  Skin:    General: Skin is warm and dry.  Neurological:     Mental Status: She is alert and oriented to person, place, and time.     Cranial Nerves: No cranial nerve deficit.     Deep Tendon Reflexes:     Reflex Scores:      Patellar reflexes are 2+ on the right side and 2+ on the left side. Psychiatric:        Mood and Affect: Mood normal.           Assessment & Plan:  Encounter for annual general medical examination with abnormal findings in adult Assessment & Plan: Immunizations UTD. Pap smear UTD. Follows with GYN  Discussed the importance of a healthy diet and regular exercise in order for weight loss, and to reduce the risk of further co-morbidity.  Exam stable. Labs pending.  Follow up in 1 year for repeat physical.    Mild exercise-induced asthma Assessment & Plan: Controlled.  Continue albuterol inhaler as needed.    Enlarged thyroid Assessment & Plan: Evident on exam today. Repeat thyroid US ordered and pending. TSH pending.  Orders: -      US THYROID; Future  Throat fullness Assessment & Plan: Likely secondary to enlarged thyroid which is evident on exam. Thyroid US ordered and pending. Referral placed to ENT.  Orders: -     Ambulatory referral to ENT -     TSH  Chronic left flank pain Assessment & Plan: Unclear etiology today, likely MSK but will rule out other causes.  UA today pending. Consider imaging.   Orders: -     Comprehensive metabolic panel with GFR -     CBC -     POCT Urinalysis Dipstick (Automated) -     Urine Microscopic  LUQ abdominal pain Assessment & Plan: Exam today reassuring, no alarm signs. Differentials include constipation, ulcer, MSK, will rule out h pylori.  Labs pending today. Consider imaging.  Await results.   Orders: -     Comprehensive metabolic panel with GFR -     CBC -     Lipid panel -     Hemoglobin A1c -     H. pylori breath test -     Lipase  Hyperlipidemia, unspecified hyperlipidemia type Assessment & Plan: Repeat lipid panel pending.           Doreene Nest, NP

## 2023-05-03 NOTE — Patient Instructions (Addendum)
 Stop by the lab prior to leaving today. I will notify you of your results once received.   You will either be contacted via phone regarding your referral to ENT, or you may receive a letter on your MyChart portal from our referral team with instructions for scheduling an appointment. Please let us know if you have not been contacted by anyone within two weeks.  You will receive a phone call for the thyroid ultrasound.  It was a pleasure to see you today!

## 2023-05-03 NOTE — Assessment & Plan Note (Signed)
 Exam today reassuring, no alarm signs. Differentials include constipation, ulcer, MSK, will rule out h pylori.  Labs pending today. Consider imaging.  Await results.

## 2023-05-03 NOTE — Assessment & Plan Note (Signed)
 Controlled.  Continue albuterol inhaler as needed.

## 2023-05-07 LAB — H. PYLORI BREATH TEST: H. pylori Breath Test: NOT DETECTED

## 2023-05-09 ENCOUNTER — Encounter: Admitting: Primary Care

## 2023-05-13 ENCOUNTER — Ambulatory Visit
Admission: RE | Admit: 2023-05-13 | Discharge: 2023-05-13 | Disposition: A | Discharge status: Home / Self Care | Source: Ambulatory Visit | Attending: Primary Care

## 2023-05-13 DIAGNOSIS — E049 Nontoxic goiter, unspecified: Secondary | ICD-10-CM

## 2023-05-15 ENCOUNTER — Encounter: Payer: Self-pay | Admitting: *Deleted

## 2023-05-24 ENCOUNTER — Ambulatory Visit (HOSPITAL_BASED_OUTPATIENT_CLINIC_OR_DEPARTMENT_OTHER)
Admission: RE | Admit: 2023-05-24 | Discharge: 2023-05-24 | Disposition: A | Discharge status: Home / Self Care | Source: Ambulatory Visit | Attending: Primary Care | Admitting: Primary Care

## 2023-05-24 DIAGNOSIS — R109 Unspecified abdominal pain: Secondary | ICD-10-CM | POA: Insufficient documentation

## 2023-05-24 DIAGNOSIS — R1012 Left upper quadrant pain: Secondary | ICD-10-CM | POA: Diagnosis present

## 2023-05-24 DIAGNOSIS — R10A2 Flank pain, left side: Secondary | ICD-10-CM

## 2023-05-24 DIAGNOSIS — G8929 Other chronic pain: Secondary | ICD-10-CM | POA: Insufficient documentation

## 2023-05-24 MED ORDER — IOHEXOL 300 MG/ML  SOLN
100.0000 mL | Freq: Once | INTRAMUSCULAR | Status: AC | PRN
  Administered 2023-05-24: 100 mL via INTRAVENOUS

## 2023-07-16 ENCOUNTER — Encounter (INDEPENDENT_AMBULATORY_CARE_PROVIDER_SITE_OTHER): Payer: Self-pay | Admitting: Otolaryngology

## 2023-07-16 ENCOUNTER — Ambulatory Visit (INDEPENDENT_AMBULATORY_CARE_PROVIDER_SITE_OTHER): Admitting: Otolaryngology

## 2023-07-16 VITALS — BP 127/79 | HR 89

## 2023-07-16 DIAGNOSIS — R09A2 Foreign body sensation, throat: Secondary | ICD-10-CM | POA: Diagnosis not present

## 2023-07-16 DIAGNOSIS — R490 Dysphonia: Secondary | ICD-10-CM

## 2023-07-16 DIAGNOSIS — J382 Nodules of vocal cords: Secondary | ICD-10-CM | POA: Diagnosis not present

## 2023-07-16 DIAGNOSIS — K219 Gastro-esophageal reflux disease without esophagitis: Secondary | ICD-10-CM | POA: Diagnosis not present

## 2023-07-16 DIAGNOSIS — E042 Nontoxic multinodular goiter: Secondary | ICD-10-CM

## 2023-07-16 MED ORDER — PREDNISONE 20 MG PO TABS: 20.0000 mg | ORAL_TABLET | Freq: Every day | ORAL | 0 refills | Status: DC

## 2023-07-16 MED ORDER — PANTOPRAZOLE SODIUM 40 MG PO TBEC: 40.0000 mg | DELAYED_RELEASE_TABLET | Freq: Every day | ORAL | 3 refills | Status: AC

## 2023-07-16 NOTE — Patient Instructions (Signed)
 Take pantoprazole once daily in morning before breakfast for 2 weeks Take prednisone  20mg  for 7 days Increase your hydration Voice rest for 2 days this weekend.

## 2023-07-16 NOTE — Progress Notes (Unsigned)
 Dear Dr. Gretta, Here is my assessment for our mutual patient, [name]. Thank you for allowing me the opportunity to care for your patient. Please do not hesitate to contact me should you have any other questions. Sincerely, Dr. Eldora Blanch  Otolaryngology Clinic Note Referring provider: Dr. Gretta HPI:  [name] is a 38 y.o. female kindly referred by Dr. Gretta for evaluation of globus sensation and pressure sensation in her throat.   Initial visit (06/2023): Goiter: diagnosed many years ago, multiple ultrasounds, she feels like it is growing.  Compressive symptoms: reports vague compressive symptoms - sometimes when she is exercising or laying down; no swallowing complaints. Reports she has to sleep on recliner (does not know if due to airway sx). 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 No prior URIs, no recent intubation. Hypo or hyperthyroid symptoms: denies Tobacco: no Patient otherwise denies: - dysphagia, odynophagia, PNA, need for Heimlich, unintentional weight loss - shortness of breath, hemoptysis - ear pain Denies current GERD sx  B/l vocal fold nodules? 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 Some dysphonia but only going on for about 2-3 weeks, improving Teacher No reflux sx --  Will do voice rest, some pred, and ppi; f/u 3 months with strobr  Prior evaluation has included: labs, US , Biopsy   History of radiation to H&N: no Family history of thyroid  cancer: no   H&N Surgery: denies Personal or FHx of bleeding dz or anesthesia difficulty: no   GLP-1: no AP/AC: no  Tobacco: no  PMHx: GERD,   Independent Review of Additional Tests or Records:  [name] (05/03/2023): noted anterior neck tightness, pressure, some restriction when laying down; no difficulty swallowing. Dx: Thyromegaly, throat fullness; Rx: TSH, ref to ENT TSH 05/03/2023:  wnl; CBC 05/03/2023: wnl Prior Bx Left nodule 11/2024): Bethesda I, unclear if distinct nodule; right nodule Bethesda II Thy US  05/13/2023: Rt nodule - 6x5.4x4.8 cm; Left nodule: 3.9x3x3 cm  PMH/Meds/All/SocHx/FamHx/ROS:   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   Depression    Exercise-induced asthma    GERD (gastroesophageal reflux disease)    Gestational diabetes    glyburide    Gestational diabetes mellitus 08/08/2016   History of asthma 10/06/2016   History of pre-eclampsia 06/12/2016   Hx of pre-eclampsia in prior pregnancy, currently pregnant    Obesity    Palpitations    normal cardiologist visit     Past Surgical History:  Procedure Laterality Date   BREAST LUMPECTOMY     CESAREAN SECTION N/A 10/04/2014   Procedure: CESAREAN SECTION;  Surgeon: Ovid All, MD;  Location: [facility];  Service: Obstetrics;  Laterality: N/A;   CESAREAN SECTION N/A 10/07/2016   Procedure: CESAREAN SECTION;  Surgeon: Timmie Norris, MD;  Location: [facility] BIRTH[facility] SUIT[facility];  Service: Obstetrics;  Laterality: N/A;   WISDOM TOOTH EXTRACTION  2010    Family History  Problem Relation Age of Onset   Hypertension Mother    Breast cancer Paternal Aunt    Diabetes Mellitus II Paternal Aunt    Healthy Father    Rheum arthritis Sister    Endometriosis Sister    Anxiety disorder Sister    Miscarriages / Stillbirths Sister    Post-traumatic stress disorder Sister    Other Sister        BENIGN BRAIN TUMOR   Diabetes Paternal  Aunt    Hypertension Maternal Grandmother    Dementia Maternal Grandmother    Hypertension Paternal Grandmother    Aneurysm Paternal Grandmother    Diabetes Mellitus II Paternal Grandmother      Social Connections: Unknown (12/24/2019)   Received from [facility]   Social Connections    Frequency of Communication with Friends and Family: Not asked    Frequency of Social Gatherings with Friends and Family: Not asked      Current  Outpatient Medications:    albuterol  (VENTOLIN  HFA) 108 (90 Base) MCG/ACT inhaler, Inhale 2 puffs into the lungs every 4 (four) hours as needed for wheezing or shortness of breath (cough, shortness of breath or wheezing.)., Disp: 1 each, Rfl: 0   hydrocortisone cream 1 %, Apply 1 application topically 2 (two) times daily as needed for itching. , Disp: , Rfl:    ibuprofen  (ADVIL ,MOTRIN ) 600 MG tablet, Take 1 tablet (600 mg total) by mouth every 6 (six) hours., Disp: 30 tablet, Rfl: 0   pantoprazole (PROTONIX) 40 MG tablet, Take 1 tablet (40 mg total) by mouth daily., Disp: 30 tablet, Rfl: 3   predniSONE  (DELTASONE ) 20 MG tablet, Take 1 tablet (20 mg total) by mouth daily with breakfast., Disp: 7 tablet, Rfl: 0   Physical Exam:   BP 127/79   Pulse 89   SpO2 96%   Salient findings:  CN II-XII intact Bilateral EAC clear and TM intact with well pneumatized middle ear spaces Anterior rhinoscopy: Septum relatively midline; bilateral inferior turbinates without significant hypertrophy No lesions of oral cavity/oropharynx No obviously palpable neck masses/lymphadenopathy/thyromegaly EXCEPT for diffuse thyromegaly b/l, appears suprasternal, relatively soft. No respiratory distress or stridor; easily lays flat and tolerates secretions; voice quality class 2; TFL was indicated to better evaluate the proximal airway, given the patient's history and exam findings, and is detailed below.  Seprately Identifiable Procedures:  Prior to initiating any procedures, risks/benefits/alternatives were explained to the patient and verbal consent obtained. Procedure Note Pre-procedure diagnosis: Dysphonia, thyromegaly, globus sensation Post-procedure diagnosis: Same Procedure: Transnasal Fiberoptic Laryngoscopy, CPT 31575 - Mod 25 Indication: see above Complications: None apparent EBL: 0 mL  The procedure was undertaken to further evaluate the patient's complaint above, with mirror exam inadequate for appropriate  examination due to gag reflex and poor patient tolerance  Procedure:  Patient was identified as correct patient. Verbal consent was obtained. The nose was sprayed with oxymetazoline and 4% lidocaine . The The flexible laryngoscope was passed through the nose to view the nasal cavity, pharynx (oropharynx, hypopharynx) and larynx.  The larynx was examined at rest and during multiple phonatory tasks. Documentation was obtained and reviewed with patient. The scope was removed. The patient tolerated the procedure well.  Findings: The nasal cavity and nasopharynx did not reveal any masses or lesions, mucosa appeared to be without obvious lesions. The tongue base, pharyngeal walls, piriform sinuses, vallecula, epiglottis and postcricoid region are normal in appearance. The visualized portion of the subglottis and proximal trachea is widely patent. The vocal folds are mobile bilaterally. There are no lesions on the free edge of the vocal folds nor elsewhere in the larynx worrisome for malignancy. Bilateral small mid-fold nodules, slight erythema, modest AP compression consistent with muscle tension dysphonia    Electronically signed by: Eldora KATHEE Blanch, MD 07/21/2023 10:33 AM   Impression & Plans:  [name] is a 38 y.o. female with:  1. Multinodular goiter   2. Globus sensation   3. Dysphonia   4. Vocal fold nodules  5. Muscle tension dysphonia   6. Gastroesophageal reflux disease, unspecified whether esophagitis present    Vague sensation of fullness and compression most likely suspected due to thyroid ; size relatively stable on US ; we discussed that she has a large nodules on both sides. Prior Bethesda I left, Bethesda II right - given stability, we discussed options: observation, repeat FNA, lobectomy (for size and compressive sx). She would like to think about this - we discussed risks for this. Would likely start with right given much larger, could also consider total but would require thyroid   medication thereafter.  In addition, has separate dysphonia for last 2-3 weeks, with VF nodules. We discussed options for this - will do short pred burst, do short PPI course, and d/w patient regarding voice Rx but declined. Discussed practicing good vocal hygiene.  F/u 8-12 weeks for strobe and repeat exam, sooner as necessary based on her decision  See below regarding exact medications prescribed this encounter including dosages and route: Meds ordered this encounter  Medications   predniSONE  (DELTASONE ) 20 MG tablet    Sig: Take 1 tablet (20 mg total) by mouth daily with breakfast.    Dispense:  7 tablet    Refill:  0   pantoprazole (PROTONIX) 40 MG tablet    Sig: Take 1 tablet (40 mg total) by mouth daily.    Dispense:  30 tablet    Refill:  3      Thank you for allowing me the opportunity to care for your patient. Please do not hesitate to contact me should you have any other questions.  Sincerely, Eldora Blanch, MD Otolaryngologist (ENT), [facility] Phone: [phone] Fax: [phone]  07/21/2023, 10:33 AM   MDM:  Level 4 - [phone] Complexity/Problems addressed: mod - chronic problem, acute problem Data complexity: high - independent interpretation of imaging, labs, notes - Morbidity: mod  - Prescription Drug prescribed or managed: y

## 2023-08-08 ENCOUNTER — Encounter: Payer: Self-pay | Admitting: Primary Care

## 2023-08-08 ENCOUNTER — Ambulatory Visit: Admitting: Primary Care

## 2023-08-08 VITALS — BP 126/80 | HR 77 | Temp 99.3°F | Ht 65.5 in | Wt 203.0 lb

## 2023-08-08 DIAGNOSIS — F411 Generalized anxiety disorder: Secondary | ICD-10-CM | POA: Diagnosis not present

## 2023-08-08 MED ORDER — ESCITALOPRAM OXALATE 10 MG PO TABS: 10.0000 mg | ORAL_TABLET | Freq: Every day | ORAL | 0 refills | Status: DC

## 2023-08-08 NOTE — Progress Notes (Signed)
 Subjective:    Patient ID: [name], female    DOB: Jun 17, 1985, 38 y.o.   MRN: [id number]  Anxiety Symptoms include nervous/anxious behavior and palpitations.      [name] is a very pleasant 38 y.o. female with a history of asthma, chronic back pain, GERD who presents today to discuss anxiety.  Her anxiety symptoms began about 1 year ago, worse since May 2025 when she and her husband decided to separate. Symptoms include being nervous, feeling scared, palpitations, diarrhea, decreased appetite with nausea, worrying, feeling overwhelmed. She's sleeping well at night for the most part, but when she's awake she has mind racing thoughts.   She does have a history of anxiety that date back to teen years. Was never treated for anxiety previously. She began therapy in 2022. She does feel down and sad given the circumstances.     08/08/2023    3:08 PM  GAD 7 : Generalized Anxiety Score  Nervous, Anxious, on Edge 3  Control/stop worrying 1  Worry too much - different things 1  Trouble relaxing 1  Restless 1  Easily annoyed or irritable 1  Afraid - awful might happen 1  Total GAD 7 Score 9  Anxiety Difficulty Somewhat difficult       08/08/2023    3:08 PM 05/03/2023    7:38 AM 09/06/2021   12:16 PM  PHQ9 SCORE ONLY  PHQ-9 Total Score 11 1 0     Review of Systems  Constitutional:  Positive for fatigue.  Cardiovascular:  Positive for palpitations.  Psychiatric/Behavioral:  Negative for sleep disturbance. The patient is nervous/anxious.        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   Depression    Exercise-induced asthma    GERD (gastroesophageal reflux disease)    Gestational diabetes    glyburide    Gestational diabetes mellitus 08/08/2016   History of asthma 10/06/2016   History of pre-eclampsia 06/12/2016   Hx of pre-eclampsia in prior pregnancy, currently pregnant    Obesity    Palpitations     normal cardiologist visit    Social History   Socioeconomic History   Marital status: Married    Spouse name: Not on file   Number of children: 1   Years of education: Not on file   Highest education level: Not on file  Occupational History   Not on file  Tobacco Use   Smoking status: Never   Smokeless tobacco: Never  Substance and Sexual Activity   Alcohol use: Yes    Comment: occas. when not preg   Drug use: No   Sexual activity: Not on file  Other Topics Concern   Not on file  Social History Narrative   Married.   1 son, pregnant.   Works as a Customer service manager, teaches high school computers.   Enjoys watching TV, reading, exercising.    Social Drivers of Corporate investment banker Strain: Not on file  Food Insecurity: Not on file  Transportation Needs: Not on file  Physical Activity: Not on file  Stress: Not on file  Social Connections: Unknown (12/24/2019)   Received from [facility]   Social Connections    Frequency of Communication with Friends and Family: Not asked    Frequency of Social Gatherings with Friends and Family: Not asked  Intimate Partner Violence: Unknown (12/24/2019)   Received from [facility]  Intimate Partner Violence    Fear of Current or Ex-Partner: Not asked    Emotionally Abused: Not asked    Physically Abused: Not asked    Sexually Abused: Not asked    Past Surgical History:  Procedure Laterality Date   BREAST LUMPECTOMY     CESAREAN SECTION N/A 10/04/2014   Procedure: CESAREAN SECTION;  Surgeon: Ovid All, MD;  Location: [facility];  Service: Obstetrics;  Laterality: N/A;   CESAREAN SECTION N/A 10/07/2016   Procedure: CESAREAN SECTION;  Surgeon: Timmie Norris, MD;  Location: [facility]IRTHING SUITES;  Service: Obstetrics;  Laterality: N/A;   WISDOM TOOTH EXTRACTION  2010    Family History  Problem Relation Age of Onset   Hypertension Mother    Breast cancer Paternal Aunt    Diabetes Mellitus II Paternal Aunt    Healthy  Father    Rheum arthritis Sister    Endometriosis Sister    Anxiety disorder Sister    Miscarriages / Stillbirths Sister    Post-traumatic stress disorder Sister    Other Sister        BENIGN BRAIN TUMOR   Diabetes Paternal Aunt    Hypertension Maternal Grandmother    Dementia Maternal Grandmother    Hypertension Paternal Grandmother    Aneurysm Paternal Grandmother    Diabetes Mellitus II Paternal Grandmother     Allergies  Allergen Reactions   Banana Nausea And Vomiting    Patient also experiences severe abdominal pain    Current Outpatient Medications on File Prior to Visit  Medication Sig Dispense Refill   ibuprofen  (ADVIL ,MOTRIN ) 600 MG tablet Take 1 tablet (600 mg total) by mouth every 6 (six) hours. 30 tablet 0   pantoprazole  (PROTONIX ) 40 MG tablet Take 1 tablet (40 mg total) by mouth daily. 30 tablet 3   hydrocortisone cream 1 % Apply 1 application topically 2 (two) times daily as needed for itching.  (Patient not taking: Reported on 08/08/2023)     No current facility-administered medications on file prior to visit.    BP 126/80   Pulse 77   Temp 99.3 F (37.4 C) (Temporal)   Ht 5' 5.5 (1.664 m)   Wt 203 lb (92.1 kg)   LMP 07/25/2023   SpO2 100%   BMI 33.27 kg/m  Objective:   Physical Exam Cardiovascular:     Rate and Rhythm: Normal rate and regular rhythm.  Pulmonary:     Effort: Pulmonary effort is normal.     Breath sounds: Normal breath sounds.  Musculoskeletal:     Cervical back: Neck supple.  Skin:    General: Skin is warm and dry.  Neurological:     Mental Status: She is alert and oriented to person, place, and time.  Psychiatric:        Mood and Affect: Mood normal.           Assessment & Plan:  GAD (generalized anxiety disorder) Assessment & Plan: Deteriorated.  Discussed options for treatment.  She will continue with her therapist.  Start Lexapro  10 mg daily.  She will take 5 mg daily x 1 week, then increase to 10 mg daily  thereafter. We discussed potential side effects.  Follow-up in 6 weeks.  Orders: -     Escitalopram  Oxalate; Take 1 tablet (10 mg total) by mouth daily. For anxiety  Dispense: 90 tablet; Refill: 0        Comer MARLA Gaskins, NP

## 2023-08-08 NOTE — Assessment & Plan Note (Signed)
 Deteriorated.  Discussed options for treatment.  She will continue with her therapist.  Start Lexapro  10 mg daily.  She will take 5 mg daily x 1 week, then increase to 10 mg daily thereafter. We discussed potential side effects.  Follow-up in 6 weeks.

## 2023-08-08 NOTE — Patient Instructions (Signed)
 Start Lexapro  10 mg tablets for anxiety.  Take 1/2 tablet by mouth once daily for 1 week, then increase to 1 full tablet daily thereafter.  Please schedule a follow up visit for 6 weeks for follow up of anxiety/depression.  It was a pleasure to see you today!

## 2023-09-19 ENCOUNTER — Ambulatory Visit: Admitting: Primary Care

## 2023-10-09 ENCOUNTER — Ambulatory Visit (INDEPENDENT_AMBULATORY_CARE_PROVIDER_SITE_OTHER): Admitting: Primary Care

## 2023-10-09 ENCOUNTER — Encounter: Payer: Self-pay | Admitting: Primary Care

## 2023-10-09 VITALS — BP 108/60 | HR 74 | Temp 97.5°F | Ht 65.5 in | Wt 195.0 lb

## 2023-10-09 DIAGNOSIS — F411 Generalized anxiety disorder: Secondary | ICD-10-CM | POA: Diagnosis not present

## 2023-10-09 DIAGNOSIS — Z23 Encounter for immunization: Secondary | ICD-10-CM

## 2023-10-09 NOTE — Progress Notes (Signed)
 Subjective:    Patient ID: [name], female    DOB: [birth year], 38 y.o.   MRN: [id number]  Taquila Leys is a very pleasant 38 y.o. female with a history of GAD who presents today for follow-up of anxiety.  She was last evaluated on 08/08/2023 for symptoms of worsening anxiety including worrying, feeling overwhelmed, palpitations, feeling nervous and scared.  She has been seeing a therapist since 2022.  During this visit we initiated Lexapro  10 mg daily given her ongoing symptoms.  She is here for follow-up today.  Since her last visit she is compliant to Lexapro  10 mg daily. She's feeling a lot better. Positive effects include feeling less anxious, less worrying, not feeling scared. She had mild headaches initially, but these symptoms have abated. She denies headaches, nausea, SI/HI.   BP Readings from Last 3 Encounters:  10/09/23 108/60  08/08/23 126/80  07/16/23 127/79      Review of Systems  Respiratory:  Negative for shortness of breath.   Cardiovascular:  Negative for palpitations.  Gastrointestinal:  Negative for nausea.  Psychiatric/Behavioral:  Negative for suicidal ideas. The patient is not nervous/anxious.          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   Depression    Exercise-induced asthma    GERD (gastroesophageal reflux disease)    Gestational diabetes    glyburide    Gestational diabetes mellitus 08/08/2016   History of asthma 10/06/2016   History of pre-eclampsia 06/12/2016   Hx of pre-eclampsia in prior pregnancy, currently pregnant    Obesity    Palpitations    normal cardiologist visit    Social History   Socioeconomic History   Marital status: Married    Spouse name: Not on file   Number of children: 1   Years of education: Not on file   Highest education level: Not on file  Occupational History   Not on file  Tobacco Use   Smoking status: Never   Smokeless tobacco: Never  Substance and  Sexual Activity   Alcohol use: Yes    Comment: occas. when not preg   Drug use: No   Sexual activity: Not on file  Other Topics Concern   Not on file  Social History Narrative   Married.   1 son, pregnant.   Works as a Customer service manager, teaches high school computers.   Enjoys watching TV, reading, exercising.    Social Drivers of Corporate investment banker Strain: Not on file  Food Insecurity: Not on file  Transportation Needs: Not on file  Physical Activity: Not on file  Stress: Not on file  Social Connections: Unknown (12/24/2019)   Received from [facility]   Social Connections    Frequency of Communication with Friends and Family: Not asked    Frequency of Social Gatherings with Friends and Family: Not asked  Intimate Partner Violence: Unknown (12/24/2019)   Received from [facility]   Intimate Partner Violence    Fear of Current or Ex-Partner: Not asked    Emotionally Abused: Not asked    Physically Abused: Not asked    Sexually Abused: Not asked    Past Surgical History:  Procedure Laterality Date   BREAST LUMPECTOMY     CESAREAN SECTION N/A 10/04/2014   Procedure: CESAREAN SECTION;  Surgeon: Ovid All, MD;  Location: [facility];  Service: Obstetrics;  Laterality: N/A;   CESAREAN SECTION N/A 10/07/2016   Procedure: CESAREAN SECTION;  Surgeon: Timmie Norris, MD;  Location: [facility] BIRTHING SUITES;  Service: Obstetrics;  Laterality: N/A;   WISDOM TOOTH EXTRACTION  2010    Family History  Problem Relation Age of Onset   Hypertension Mother    Breast cancer Paternal Aunt    Diabetes Mellitus II Paternal Aunt    Healthy Father    Rheum arthritis Sister    Endometriosis Sister    Anxiety disorder Sister    Miscarriages / Stillbirths Sister    Post-traumatic stress disorder Sister    Other Sister        BENIGN BRAIN TUMOR   Diabetes Paternal Aunt    Hypertension Maternal Grandmother    Dementia Maternal Grandmother    Hypertension Paternal Grandmother     Aneurysm Paternal Grandmother    Diabetes Mellitus II Paternal Grandmother     Allergies  Allergen Reactions   Banana Nausea And Vomiting    Patient also experiences severe abdominal pain    Current Outpatient Medications on File Prior to Visit  Medication Sig Dispense Refill   escitalopram  (LEXAPRO ) 10 MG tablet Take 1 tablet (10 mg total) by mouth daily. For anxiety 90 tablet 0   hydrocortisone cream 1 % Apply 1 application topically 2 (two) times daily as needed for itching.      ibuprofen  (ADVIL ,MOTRIN ) 600 MG tablet Take 1 tablet (600 mg total) by mouth every 6 (six) hours. 30 tablet 0   pantoprazole  (PROTONIX ) 40 MG tablet Take 1 tablet (40 mg total) by mouth daily. (Patient not taking: Reported on 10/09/2023) 30 tablet 3   No current facility-administered medications on file prior to visit.    BP 108/60   Pulse 74   Temp (!) 97.5 F (36.4 C) (Temporal)   Ht 5' 5.5 (1.664 m)   Wt 195 lb (88.5 kg)   LMP 09/14/2023   SpO2 100%   BMI 31.96 kg/m  Objective:   Physical Exam Cardiovascular:     Rate and Rhythm: Normal rate and regular rhythm.  Pulmonary:     Effort: Pulmonary effort is normal.     Breath sounds: Normal breath sounds.  Musculoskeletal:     Cervical back: Neck supple.  Skin:    General: Skin is warm and dry.  Neurological:     Mental Status: She is alert and oriented to person, place, and time.  Psychiatric:        Mood and Affect: Mood normal.     Physical Exam        Assessment & Plan:  GAD (generalized anxiety disorder) Assessment & Plan: Improved  Continue Lexapro  10 mg daily. Continue therapy sessions.   Follow up in April 2026      Assessment and Plan Assessment & Plan         Comer MARLA Gaskins, NP    History of Present Illness

## 2023-10-09 NOTE — Assessment & Plan Note (Addendum)
 Improved  Continue Lexapro  10 mg daily. Continue therapy sessions.   Follow up in April 2026

## 2023-10-09 NOTE — Patient Instructions (Signed)
 Continue taking Lexapro  10 mg daily for anxiety.  Schedule your physical for April 2026.  It was a pleasure to see you today!

## 2023-10-16 ENCOUNTER — Ambulatory Visit (INDEPENDENT_AMBULATORY_CARE_PROVIDER_SITE_OTHER): Admitting: Otolaryngology

## 2023-10-16 ENCOUNTER — Encounter (INDEPENDENT_AMBULATORY_CARE_PROVIDER_SITE_OTHER): Payer: Self-pay | Admitting: Otolaryngology

## 2023-10-16 VITALS — BP 124/84 | HR 60 | Ht 65.5 in | Wt 194.0 lb

## 2023-10-16 DIAGNOSIS — J382 Nodules of vocal cords: Secondary | ICD-10-CM | POA: Diagnosis not present

## 2023-10-16 DIAGNOSIS — K219 Gastro-esophageal reflux disease without esophagitis: Secondary | ICD-10-CM

## 2023-10-16 DIAGNOSIS — R490 Dysphonia: Secondary | ICD-10-CM | POA: Diagnosis not present

## 2023-10-16 DIAGNOSIS — J3089 Other allergic rhinitis: Secondary | ICD-10-CM

## 2023-10-16 DIAGNOSIS — R09A2 Foreign body sensation, throat: Secondary | ICD-10-CM | POA: Diagnosis not present

## 2023-10-16 DIAGNOSIS — E042 Nontoxic multinodular goiter: Secondary | ICD-10-CM

## 2023-10-16 DIAGNOSIS — R0981 Nasal congestion: Secondary | ICD-10-CM

## 2023-10-16 MED ORDER — FLUTICASONE PROPIONATE 50 MCG/ACT NA SUSP: 2.0000 | Freq: Two times a day (BID) | NASAL | 6 refills | Status: AC

## 2023-10-16 NOTE — Patient Instructions (Signed)
 Use flonase  two sprays each nostril twice daily -- use for 4-6 weeks after labor day and super bowl Aurel[facility] Med Nasal Saline Rinse  - start nasal saline rinses with NeilMed Bottle available over the counter    Nasal Saline Irrigation instructions: If you choose to make your own salt water  solution, You will need: Salt (kosher, canning, or pickling salt) Baking soda Nasal irrigation bottle (i.e. Aureliano Med Sinus Rinse) Measuring spoon ( teaspoon) Distilled / boiled water    Mix solution Mix 1 teaspoon of salt, 1/2 teaspoon of baking soda and 1 cup of water  into irrigation bottle ** May use saline packet instead of homemade recipe for this step if you prefer If medicine was prescribed to be mixed with solution, place this into bottle Examples 2 inches of 2% mupirocin ointment Budesonide solution Position your head: Lean over sink (about 45 degrees) Rotate head (about 45 degrees) so that one nostril is above the other Irrigate Insert tip of irrigation bottle into upper nostril so it forms a comfortable seal Irrigate while breathing through your mouth May remove the straw from the bottle in order to irrigate the entire solution (important if medicine was added) Exhale through nose when finished and blow nose as necessary  Repeat on opposite side with other 1/2 of solution (120 mL) or remake solution if all 240 mL was used on first side Wash irrigation bottle regularly, replace every 3 months

## 2023-10-16 NOTE — Progress Notes (Signed)
 Dear Dr. Gretta, Here is my assessment for our mutual patient, [name]. Thank you for allowing me the opportunity to care for your patient. Please do not hesitate to contact me should you have any other questions. Sincerely, Dr. Eldora Blanch  Otolaryngology Clinic Note Referring provider: Dr. Gretta HPI:  [name] is a 38 y.o. female kindly referred by Dr. Gretta for evaluation of globus sensation and pressure sensation in her throat.   Initial visit (06/2023): Goiter: diagnosed many years ago, multiple ultrasounds, she feels like it is growing.  Compressive symptoms: reports vague compressive symptoms - sometimes when she is exercising or laying down; no swallowing complaints. Reports she has to sleep on recliner (does not know if due to airway sx). 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 No prior URIs, no recent intubation. Hypo or hyperthyroid symptoms: denies Tobacco: no Patient otherwise denies: - dysphagia, odynophagia, PNA, need for Heimlich, unintentional weight loss - shortness of breath, hemoptysis - ear pain Denies current GERD sx  --------------------------------------------------------- 10/16/2023 She reports that she was doing well with her voice and it returned to normal, but a couple of weeks ago, she began to cough again and had some nasal congestion.Does not report h/o allergies. Felt like she had a URI/sinus issue. Gets about 2 sinus infections/year around spring/fall.   She stopped the pantoprazole . She has some soreness in thyroid  area when she coughs a lot, but denies dysphagia, SOB. Mild compression when she is laying down while exercising. Sleeping w/o SOB  Prior evaluation has included: labs, US , Biopsy   History of radiation to H&N: no Family history of thyroid  cancer: no   H&N Surgery: denies Personal or FHx of bleeding dz or anesthesia  difficulty: no   GLP-1: no AP/AC: no  Tobacco: no  PMHx: GERD, GAD  Independent Review of Additional Tests or Records:  Comer Gretta (05/03/2023): noted anterior neck tightness, pressure, some restriction when laying down; no difficulty swallowing. 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   Depression    Exercise-induced asthma    GERD (gastroesophageal reflux disease)    Gestational diabetes    glyburide    Gestational diabetes mellitus 08/08/2016   History of asthma 10/06/2016   History of pre-eclampsia 06/12/2016   Hx of pre-eclampsia in prior pregnancy, currently pregnant    Obesity    Palpitations    normal cardiologist visit     Past Surgical History:  Procedure Laterality Date   BREAST LUMPECTOMY     CESAREAN SECTION N/A 10/04/2014   Procedure: CESAREAN SECTION;  Surgeon: Ovid All, MD;  Location: [facility];  Service: Obstetrics;  Laterality: N/A;   CESAREAN SECTION N/A 10/07/2016   Procedure: CESAREAN SECTION;  Surgeon: Timmie Norris, MD;  Location: [facility] BIRTHING SUITES;  Service: Obstetrics;  Laterality: N/A;   WISDOM TOOTH EXTRACTION  2010    Family History  Problem Relation Age of Onset   Hypertension Mother    Breast cancer Paternal Aunt    Diabetes Mellitus II Paternal Aunt    Healthy Father    Rheum arthritis Sister  Endometriosis Sister    Anxiety disorder Sister    Miscarriages / [place] Sister    Post-traumatic stress disorder Sister    Other Sister        BENIGN BRAIN TUMOR   Diabetes Paternal Aunt    Hypertension Maternal Grandmother    Dementia Maternal Grandmother    Hypertension Paternal Grandmother    Aneurysm  Paternal Grandmother    Diabetes Mellitus II Paternal Grandmother      Social Connections: Unknown (12/24/2019)   Received from [facility]   Social Connections    Frequency of Communication with Friends and Family: Not asked    Frequency of Social Gatherings with Friends and Family: Not asked      Current Outpatient Medications:    escitalopram  (LEXAPRO ) 10 MG tablet, Take 1 tablet (10 mg total) by mouth daily. For anxiety, Disp: 90 tablet, Rfl: 0   fluticasone  (FLONASE ) 50 MCG/ACT nasal spray, Place 2 sprays into both nostrils in the morning and at bedtime., Disp: 16 g, Rfl: 6   hydrocortisone cream 1 %, Apply 1 application topically 2 (two) times daily as needed for itching. , Disp: , Rfl:    ibuprofen  (ADVIL ,MOTRIN ) 600 MG tablet, Take 1 tablet (600 mg total) by mouth every 6 (six) hours., Disp: 30 tablet, Rfl: 0   pantoprazole  (PROTONIX ) 40 MG tablet, Take 1 tablet (40 mg total) by mouth daily., Disp: 30 tablet, Rfl: 3   Physical Exam:   BP 124/84 (BP Location: Right Arm, Patient Position: Sitting)   Pulse 60   Ht 5' 5.5 (1.664 m)   Wt 194 lb (88 kg)   LMP 09/14/2023 (Approximate)   SpO2 97%   BMI 31.79 kg/m   Salient findings:  CN II-XII intact Bilateral EAC clear and TM intact with well pneumatized middle ear spaces Anterior rhinoscopy: Septum relatively midline; bilateral inferior turbinates without significant hypertrophy but with modest mucoid secretions No lesions of oral cavity/oropharynx No obviously palpable neck masses/lymphadenopathy/thyromegaly EXCEPT for diffuse thyromegaly b/l, appears suprasternal, relatively soft. No respiratory distress or stridor; easily lays flat and tolerates secretions; voice quality class 2, stable; Stroboscopy was indicated to better evaluate the proximal airway and vocal fold nodules, given the patient's history and exam findings, and is detailed below.  Seprately Identifiable Procedures:  Prior to initiating any procedures,  risks/benefits/alternatives were explained to the patient and verbal consent obtained.  Procedure Note Pre-procedure diagnosis:  Dysphonia, Vocal fold nodules, assess response Post-procedure diagnosis: Same Procedure: Video Laryngostroboscopy; CPT [phone] - Mod 25 Indication: See pre-procedure diagnosis:   The procedure was undertaken to further evaluate the patient's complaint above; a transnasal video laryngostroboscopy (VLS) was performed to closely evaluate the patient's laryngeal biomechanics and vocal fold oscillation.  Patient was identified as correct patient. Verbal consent was obtained. The nose was sprayed bilaterally as below. The flexible scope was passed through the nose to view the nasal cavity, pharynx (oropharynx, hypopharynx) and larynx.  The larynx was examined at rest and during multiple phonatory tasks. Documentation was obtained and reviewed with patient. The scope was removed. The patient tolerated the procedure well.  VLS Exam Detail:  Amplitude - Left: Slight decrease Amplitude - Right: Slight decrease  Mucosal Wave - Left: Slight decrease Mucosal Wave - Right: Slight decrease  Vocal Fold Motion - Left: No motion abnormalities Vocal Fold Motion - Right: No motion abnormalities  Vertical Level of Approximation: equal Glottic Closure: complete  Phase Symmetry: symmetric Periodicity (Regularity): regular  Supraglottic Hyperfunction: mixed Nasopharynx: Bilateral tori wnl, no  masses or lesions  Airway: Widely Patent Additional Observations: B/l VF nodules now trace with slight edema currently   Exam Summary: Examiner: Eldora Blanch, MD Diagnostician: Eldora Blanch, MD  Anesthesia: Yes, Topical (oxymetazoline and 4% lidocaine ) Sensitivity to Scope: minimal   Summary of Findings: The videostroboscopic exam also revealed A-P and lateral compression of the supraglottis with phonation. There were no masses or vocal fold motion abnormalities on exam.   Electronically signed by:  Eldora KATHEE Blanch, MD 10/16/2023 8:34 PM  Impression & Plans:  [name] is a 38 y.o. female with:  1. Multinodular goiter   2. Globus sensation   3. Dysphonia   4. Vocal fold nodules   5. Muscle tension dysphonia   6. Gastroesophageal reflux disease, unspecified whether esophagitis present   7. Nasal congestion   8. Other allergic rhinitis    Vague sensation of fullness and compression most likely suspected due to thyroid ; size relatively stable on US ; we discussed that she has a large nodules on both sides. Prior Bethesda I left, Bethesda II right - given stability, we discussed options: observation, repeat FNA, lobectomy (for size and compressive sx). She reports her symptoms are quite manageable currently and she'd like to observe for now.   VF nodules and dypshonia: initially resolved, and nodules significantly better. Now suspect allergy and associated sx playing a role in her voice. We discussed options for this - will do nasacort and consider daily rinses (spring and fall), can stop PPI (no current GERD sx); decl voice Rx. Discussed practicing good vocal hygiene.  F/u in 1 year, sooner as necessary if voice declines; can do US  again in about a year.  See below regarding exact medications prescribed this encounter including dosages and route: Meds ordered this encounter  Medications   fluticasone  (FLONASE ) 50 MCG/ACT nasal spray    Sig: Place 2 sprays into both nostrils in the morning and at bedtime.    Dispense:  16 g    Refill:  6      Thank you for allowing me the opportunity to care for your patient. Please do not hesitate to contact me should you have any other questions.  Sincerely, Eldora Blanch, MD Otolaryngologist (ENT), [facility] Phone: [phone] Fax: [phone]  10/16/2023, 8:34 PM   MDM:  Level 4 - 99214 Complexity/Problems addressed: mod - chronic problems Data complexity: low - Morbidity: mod  - Drug prescribed or managed:  y

## 2023-11-05 ENCOUNTER — Other Ambulatory Visit: Payer: Self-pay

## 2023-11-05 DIAGNOSIS — F411 Generalized anxiety disorder: Secondary | ICD-10-CM

## 2023-11-05 MED ORDER — ESCITALOPRAM OXALATE 10 MG PO TABS: 10.0000 mg | ORAL_TABLET | Freq: Every day | ORAL | 1 refills | Status: AC
